# Patient Record
Sex: Male | Born: 1985 | Hispanic: No | Marital: Married | Smoking: Former smoker
Health system: Southern US, Community
[De-identification: ages and names within clinical notes are randomized; demographics above are authoritative.]

## PROBLEM LIST (undated history)

## (undated) DIAGNOSIS — Z789 Other specified health status: Secondary | ICD-10-CM

## (undated) HISTORY — DX: Other specified health status: Z78.9

## (undated) HISTORY — PX: OTHER SURGICAL HISTORY: SHX169

---

## 2021-02-18 ENCOUNTER — Other Ambulatory Visit: Payer: Self-pay

## 2021-02-19 ENCOUNTER — Telehealth: Payer: Self-pay

## 2021-02-19 ENCOUNTER — Ambulatory Visit: Payer: Self-pay

## 2021-02-19 DIAGNOSIS — R7612 Nonspecific reaction to cell mediated immunity measurement of gamma interferon antigen response without active tuberculosis: Secondary | ICD-10-CM | POA: Insufficient documentation

## 2021-02-19 NOTE — Progress Notes (Signed)
All information contained in this document/encounter was obtained/confirmed during phone interview with pt. Not taking any medications or MVI. Pt unsure of height or current weight. Pt believes he is not underweight. Pt states he is having an issue with left kidney (no diagnosis) that will be evaluated after referral to PCP. Pt states he will need assistance with transportation to [facility] for Chest X-Ray.

## 2021-02-19 NOTE — Telephone Encounter (Signed)
Phone call to pt with language line interpreter for [employer], ID# [id number]. See Epi completed in separate document/encounter dated 02/19/21.

## 2021-03-12 DIAGNOSIS — G8929 Other chronic pain: Secondary | ICD-10-CM | POA: Insufficient documentation

## 2021-03-12 DIAGNOSIS — Z0289 Encounter for other administrative examinations: Secondary | ICD-10-CM | POA: Insufficient documentation

## 2021-03-12 DIAGNOSIS — M545 Low back pain, unspecified: Secondary | ICD-10-CM | POA: Insufficient documentation

## 2021-03-15 ENCOUNTER — Ambulatory Visit
Admission: RE | Admit: 2021-03-15 | Discharge: 2021-03-15 | Disposition: A | Discharge status: Home / Self Care | Payer: Self-pay | Attending: Family Medicine | Admitting: Family Medicine

## 2021-03-15 ENCOUNTER — Ambulatory Visit
Admission: RE | Admit: 2021-03-15 | Discharge: 2021-03-15 | Disposition: A | Discharge status: Home / Self Care | Payer: Self-pay | Source: Ambulatory Visit | Attending: Family Medicine | Admitting: Family Medicine

## 2021-03-15 DIAGNOSIS — R7612 Nonspecific reaction to cell mediated immunity measurement of gamma interferon antigen response without active tuberculosis: Secondary | ICD-10-CM

## 2021-03-22 ENCOUNTER — Telehealth: Payer: Self-pay

## 2021-03-22 NOTE — Telephone Encounter (Signed)
Chest x-ray negative.   Need to: Offer LTBI   (If desired, case worker (John [phone]) may be able to help with transportation. Schedule extra time for LTBI med appts on Tues, Wed, or Thursday.   [facility] available now Waitlist for Rifampin) 

## 2021-03-22 NOTE — Telephone Encounter (Signed)
Phone call to pt with Language Line interpreter [phone].  Pt counseled about latent vs active TB and LTBI medications.  Pt will establish care with PCP; needs to establish care for kidney pain/issue.  May discuss TB meds with that Dr also.  Is not interested in taking LTBI at this time.  Pt counseled that if he decides to to start LTBI medicines he can call us at [facility].  We would need a list of his current medications at that time. It is a medicine you take consistently every month for several months, and would come back every month to discuss side effects, get a new bottle, and maybe bloodwork (dependant upon [facility] Dr orders).  Pt given [facility] contact info.

## 2021-04-23 ENCOUNTER — Telehealth: Payer: Self-pay

## 2021-04-23 NOTE — Telephone Encounter (Signed)
Phone call to pt with Pashto language line interpreter.   Pt is interested in LTBI medication.   Pt states he will get his medication information to [facility] as soon as possible and will also bring it with him to his appt.  LTBI start appt scheduled for 05/08/21, arrive at 8:00 AM.  Pt requested help in getting appt information to case worker since they do not have a car or any transportation. Case worker, Jonny Ruiz, contacted at [phone], per pt request. Jonny Ruiz will arrange transportation for the appt.

## 2021-04-29 ENCOUNTER — Other Ambulatory Visit: Payer: Self-pay

## 2021-04-29 DIAGNOSIS — R7612 Nonspecific reaction to cell mediated immunity measurement of gamma interferon antigen response without active tuberculosis: Secondary | ICD-10-CM

## 2021-04-29 NOTE — Progress Notes (Addendum)
Tuberculosis treatment orders  All patients are to be monitored per [place] and county TB policies.   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 Treat for latent TB per the following:  Rifampin 600mg  daily by mouth x 4 months  +Tspot 02/05/2021 CXR without Active TB 03/15/2021 Negative HIV 02/05/2021 EPI 02/19/2021 Negative HIV 02/05/2021 Refugee- [facility]  Current med: Naproxen Normal baseline labs 03/12/2021

## 2021-06-24 ENCOUNTER — Other Ambulatory Visit: Payer: Self-pay

## 2021-06-27 ENCOUNTER — Telehealth: Payer: Self-pay

## 2021-06-27 DIAGNOSIS — R7612 Nonspecific reaction to cell mediated immunity measurement of gamma interferon antigen response without active tuberculosis: Secondary | ICD-10-CM

## 2021-06-27 NOTE — Telephone Encounter (Signed)
TC to patient.  [facility] appt in December for LTBI  tx d/t transportation.  Patient states has permit and hopefully will have license in a month or 2.  TB control will call patient in 2 months to schedule LTBI appt.  Will need new TB orders and to update EPI at that time.Richmond Campbell, RN

## 2021-07-17 ENCOUNTER — Other Ambulatory Visit: Payer: Self-pay

## 2021-07-17 ENCOUNTER — Ambulatory Visit: Payer: Medicaid Other | Admitting: Nurse Practitioner

## 2021-07-17 ENCOUNTER — Encounter: Payer: Self-pay | Admitting: Nurse Practitioner

## 2021-07-17 VITALS — BP 119/74 | HR 70 | Temp 98.7°F | Ht 67.9 in | Wt 151.2 lb

## 2021-07-17 DIAGNOSIS — Z3009 Encounter for other general counseling and advice on contraception: Secondary | ICD-10-CM | POA: Diagnosis not present

## 2021-07-17 DIAGNOSIS — Z789 Other specified health status: Secondary | ICD-10-CM | POA: Insufficient documentation

## 2021-07-17 DIAGNOSIS — Z7689 Persons encountering health services in other specified circumstances: Secondary | ICD-10-CM | POA: Diagnosis not present

## 2021-07-17 DIAGNOSIS — R7612 Nonspecific reaction to cell mediated immunity measurement of gamma interferon antigen response without active tuberculosis: Secondary | ICD-10-CM | POA: Diagnosis not present

## 2021-07-17 NOTE — Progress Notes (Signed)
BP 119/74    Pulse 70    Temp 98.7 F (37.1 C) (Oral)    Ht 5' 7.9" (1.725 m)    Wt 151 lb 3.2 oz (68.6 kg)    SpO2 98%    BMI 23.06 kg/m    Subjective:    Patient ID: Victor Cross, male    DOB: 1986-03-13, 36 y.o.   MRN: [id number]  HPI: Victor Cross is a 36 y.o. male  Chief Complaint  Patient presents with   New Patient (Initial Visit)    Pt states that have been trying to have kids for 8 years with no success   Patient presents to clinic to establish care with new PCP.  Patient recently moved here from [place].  Introduced to Designer, jewellery role and practice setting.  All questions answered.  Discussed provider/patient relationship and expectations.  Patient denies any significant medical history.  Patient states he an his wife have been trying to conceive for 8 years and have not been successful.  States he has some bad teeth.  He would like to see a dentist to address this.  Denies any surgical history.  Patient did have a positive T spot but the chest xray was negative and did not show an acute infection.   Patient denies a history of: Hypertension, Elevated Cholesterol, Diabetes, Thyroid problems, Depression, Anxiety, Neurological problems, and Abdominal problems.    Denies HA, CP, SOB, dizziness, palpitations, visual changes, and lower extremity swelling.   Active Ambulatory Problems    Diagnosis Date Noted   Positive QuantiFERON-TB Gold test 02/19/2021   Chronic left-sided low back pain without sciatica 03/12/2021   Refugee health examination 03/12/2021   Patient denies medical problems    Resolved Ambulatory Problems    Diagnosis Date Noted   No Resolved Ambulatory Problems   No Additional Past Medical History   Past Surgical History:  Procedure Laterality Date   denies     History reviewed. No pertinent family history.    Review of Systems  HENT:  Positive for dental problem.   Eyes:  Negative for visual disturbance.  Respiratory:   Negative for shortness of breath.   Cardiovascular:  Negative for chest pain and leg swelling.  Genitourinary:        Not able to conceive.  Neurological:  Negative for light-headedness and headaches.   Per HPI unless specifically indicated above     Objective:    BP 119/74    Pulse 70    Temp 98.7 F (37.1 C) (Oral)    Ht 5' 7.9" (1.725 m)    Wt 151 lb 3.2 oz (68.6 kg)    SpO2 98%    BMI 23.06 kg/m   Wt Readings from Last 3 Encounters:  07/17/21 151 lb 3.2 oz (68.6 kg)    Physical Exam Vitals and nursing note reviewed.  Constitutional:      General: He is not in acute distress.    Appearance: Normal appearance. He is not ill-appearing, toxic-appearing or diaphoretic.  HENT:     Head: Normocephalic.     Right Ear: External ear normal.     Left Ear: External ear normal.     Nose: Nose normal. No congestion or rhinorrhea.     Mouth/Throat:     Mouth: Mucous membranes are moist.  Eyes:     General:        Right eye: No discharge.        Left eye: No discharge.  Extraocular Movements: Extraocular movements intact.     Conjunctiva/sclera: Conjunctivae normal.     Pupils: Pupils are equal, round, and reactive to light.  Cardiovascular:     Rate and Rhythm: Normal rate and regular rhythm.     Heart sounds: No murmur heard. Pulmonary:     Effort: Pulmonary effort is normal. No respiratory distress.     Breath sounds: Normal breath sounds. No wheezing, rhonchi or rales.  Abdominal:     General: Abdomen is flat. Bowel sounds are normal.  Musculoskeletal:     Cervical back: Normal range of motion and neck supple.  Skin:    General: Skin is warm and dry.     Capillary Refill: Capillary refill takes less than 2 seconds.  Neurological:     General: No focal deficit present.     Mental Status: He is alert and oriented to person, place, and time.  Psychiatric:        Mood and Affect: Mood normal.        Behavior: Behavior normal.        Thought Content: Thought content  normal.        Judgment: Judgment normal.    No results found for this or any previous visit.    Assessment & Plan:   Problem List Items Addressed This Visit       Other   Positive QuantiFERON-TB Gold test    Patient endorses having BCG prior to coming to the [place]. Blood work shows a T Best boy but no Quantiferon gold.  Chest xray was negative. Will draw Quantiferon Gold in office today. Will send to ID if positive.  Will make recommendations based on lab results       Relevant Orders   QuantiFERON-TB Gold Plus   Patient denies medical problems    Patient needs to see a dentist.  Has difficulty making appointments due to language barrier.  Will work with Referral coordinator to make patient appointments.      Other Visit Diagnoses     Family planning    -  Primary   Will refer patient to GYN for evaluation with his wife.  Patient understands and agrees with the plan of care.    Relevant Orders   Ambulatory referral to Obstetrics / Gynecology   Encounter to establish care            Follow up plan: Return in about 3 months (around 10/14/2021) for Physical and Fasting labs.

## 2021-07-17 NOTE — Assessment & Plan Note (Signed)
Patient endorses having BCG prior to coming to the [place]. Blood work shows a T SPOT but no Quantiferon gold.  Chest xray was negative. Will draw Quantiferon Gold in office today. Will send to ID if positive.  Will make recommendations based on lab results  °

## 2021-07-17 NOTE — Assessment & Plan Note (Signed)
Patient needs to see a dentist.  Has difficulty making appointments due to language barrier.  Will work with Referral coordinator to make patient appointments.

## 2021-07-20 LAB — QUANTIFERON-TB GOLD PLUS
QuantiFERON Mitogen Value: >10 IU/mL
QuantiFERON Nil Value: 1.1 IU/mL
QuantiFERON TB1 Ag Value: 8.34 IU/mL
QuantiFERON TB2 Ag Value: 6.9 IU/mL
QuantiFERON-TB Gold Plus: POSITIVE — AB

## 2021-07-23 ENCOUNTER — Telehealth: Payer: Self-pay | Admitting: Nurse Practitioner

## 2021-07-23 NOTE — Addendum Note (Signed)
Addended by: Larae Grooms on: 07/23/2021 09:40 AM   Modules accepted: Orders

## 2021-07-23 NOTE — Progress Notes (Signed)
Please let patient know that his Tuberculosis test was positive. I recommend that he be seen by Infectious Disease to determine further if he needs to be treated.

## 2021-07-23 NOTE — Telephone Encounter (Signed)
Copied from [place] [phone]. Topic: General - Other >> Jul 23, 2021 11:28 AM Pawlus, Brayton Layman A wrote: Reason for CRM: Caller from [facility] stated she can not contact the pt, not able to leave a VM or reach the pt. Please advise pt to contact [facility].

## 2021-07-24 NOTE — Telephone Encounter (Signed)
See result note, documented there.  ?

## 2021-08-29 ENCOUNTER — Encounter: Payer: Self-pay | Admitting: Infectious Diseases

## 2021-08-29 ENCOUNTER — Ambulatory Visit: Payer: Medicaid Other | Attending: Infectious Diseases | Admitting: Infectious Diseases

## 2021-08-29 VITALS — BP 109/71 | HR 65 | Temp 97.6°F | Wt 156.0 lb

## 2021-08-29 DIAGNOSIS — R7612 Nonspecific reaction to cell mediated immunity measurement of gamma interferon antigen response without active tuberculosis: Secondary | ICD-10-CM | POA: Diagnosis present

## 2021-08-29 DIAGNOSIS — M549 Dorsalgia, unspecified: Secondary | ICD-10-CM | POA: Insufficient documentation

## 2021-08-29 DIAGNOSIS — H9202 Otalgia, left ear: Secondary | ICD-10-CM | POA: Diagnosis not present

## 2021-08-29 DIAGNOSIS — G8929 Other chronic pain: Secondary | ICD-10-CM | POA: Diagnosis not present

## 2021-08-29 DIAGNOSIS — Z227 Latent tuberculosis: Secondary | ICD-10-CM | POA: Diagnosis not present

## 2021-08-29 NOTE — Patient Instructions (Signed)
You are referred for Latent TB- I spoke to health dept and Richmond Campbell [phone] will call you to set an appt for treatment ?

## 2021-08-29 NOTE — Progress Notes (Signed)
NAME: Victor Cross  ?DOB: 1985-07-28  ?MRN: [id number]  ?Date/Time: 08/29/2021 10:57 AM ? ?REQUESTING PROVIDER: Larae Cross ?Subjective:  ?REASON FOR CONSULT: positive quantiferon Gold ??pt speaks pashto- used AMS /str[employer] video interpretation service ?Victor Cross is a 36 y.o. male is here with his wife to discuss positive quantiferon gold test result and for management ?He is from [place] amd moved to the [place] last year- Had a positive T spot done at [facility] center as part of Refugee health work up- HIV NR,  ?His wife ( bibi, Rodman Key )is also positive- They both do not give a h/o treated TB, exposure to Tb or having TB in [place]- they are not sure whether they got BCG vaccine'- They dont know what that is ?HE was never hospitalized before. ?His T spot from Sept 2022 is positive ?HIV , HEPC, HEPB negative ?HE was referred to [facility] health Dept by [facility] center for treatment of positiev T spot but he could never keep the appt due to lack of transportation- HE as now moved to [place] and would like to get treatment ?HE also c/o left ear pain and was given Augmentin by his PCP with no improvement- he wants referral to ENT. He has chronic left sided back pain- renal function normal ? ? ?Past Medical History:  ?Diagnosis Date  ? Patient denies medical problems   ?  ?Past Surgical History:  ?Procedure Laterality Date  ? denies    ?  ?Social History  ? ?Socioeconomic History  ? Marital status: Married  ?  Spouse name: Not on file  ? Number of children: Not on file  ? Years of education: Not on file  ? Highest education level: Not on file  ?Occupational History  ? Not on file  ?Tobacco Use  ? Smoking status: Former  ?  Types: Cigarettes  ? Smokeless tobacco: Current  ?Vaping Use  ? Vaping Use: Never used  ?Substance and Sexual Activity  ? Alcohol use: Never  ? Drug use: Never  ? Sexual activity: Yes  ?  Birth control/protection: None  ?Other Topics Concern  ? Not on file  ?Social  History Narrative  ? Not on file  ? ?Social Determinants of Health  ? ?Financial Resource Strain: Not on file  ?Food Insecurity: Not on file  ?Transportation Needs: Not on file  ?Physical Activity: Not on file  ?Stress: Not on file  ?Social Connections: Not on file  ?Intimate Partner Violence: Not on file  ?  ?No family history on file. ?No Known Allergies ?I? ?No current outpatient medications on file.  ? ?No current facility-administered medications for this visit.  ?  ? ?Abtx:  ?Anti-infectives (From admission, onward)  ? ? None  ? ?  ? ? ?REVIEW OF SYSTEMS:  ?Const: negative fever, negative chills, negative weight loss ?Eyes: negative diplopia or visual changes, negative eye pain ?ENT: left ear ache ?Resp: negative cough, hemoptysis, dyspnea ?Cards: negative for chest pain, palpitations, lower extremity edema ?GU: negative for frequency, dysuria and hematuria ?GI: Negative for abdominal pain, diarrhea, bleeding, constipation ?Skin: negative for rash and pruritus ?Heme: negative for easy bruising and gum/nose bleeding ?MS: left sided back pain ?Neurolo:negative for headaches, dizziness, vertigo, memory problems  ?Psych: negative for feelings of anxiety, depression  ?Endocrine: negative for thyroid, diabetes ?Allergy/Immunology- negative for any medication or food allergies ? ?Objective:  ?VITALS:  ?BP 109/71   Pulse 65   Temp 97.6 ?F (36.4 ?C) (Temporal)   Wt 156 lb (  70.8 kg)   BMI 23.79 kg/m?  ?PHYSICAL EXAM:  ?General: Alert, cooperative, no distress, appears stated age.  ?Head: Normocephalic, without obvious abnormality, atraumatic. ?Eyes: Conjunctivae clear, anicteric sclerae. Pupils are equal ?ENT Nares normal. No drainage or sinus tenderness. ?Lips, mucosa, and tongue normal. No Thrush ?Neck: Supple, symmetrical, no adenopathy, thyroid: non tender ?no carotid bruit and no JVD. ?Back: No CVA tenderness. ?Lungs: Clear to auscultation bilaterally. No Wheezing or Rhonchi. No rales. ?Heart: Regular rate and  rhythm, no murmur, rub or gallop. ?Abdomen: Soft, non-tender,not distended. Bowel sounds normal. No masses ?Extremities: atraumatic, no cyanosis. No edema. No clubbing ?Skin: No rashes or lesions. Or bruising ?Lymph: Cervical, supraclavicular normal. ?Neurologic: Grossly non-focal ?Pertinent Labs ?Lab Results ?CBC ?No results found for: WBC, RBC, HGB, HCT, PLT, MCV, MCH, MCHC, RDW, LYMPHSABS, MONOABS, EOSABS, BASOSABS ? ?   ? View : No data to display.  ?  ?  ?  ? ? ? ?IMAGING RESULTS: ?CXR 03/15/21- Normal- no pulmonary infiltrate ?I have personally reviewed the films ?? ?Impression/Recommendation ?Pt from [place], has positive quantiferon Gold, neg CXR and neg symptoms ?Indicates TB exposure and no active TB ?Will refer to [place] health dept to start treatment for latent tb with either  Rifampin- x 4 months or Weekly INH rifapentine for 12 weeks ? ?Spoke to [employer]  [phone] and she will make an appt for next week with MD and will call them ?Discussed with patient in great detail ? ?PT to follow up with is PCP for ear pain and back pain both of which are chronic. ?Follow PRN ?? ?? ??Note:  This document was prepared using Dragon voice recognition software and may include unintentional dictation errors.  ?

## 2021-09-04 ENCOUNTER — Telehealth: Payer: Self-pay

## 2021-09-04 NOTE — Telephone Encounter (Signed)
Phone call to pt. Pt is interested in LTBI. If pt decides to take LTBI daily for 4 months rather than weekly for 12 weeks, orders in Epic dated 04/29/21. Pt has job interview coming up, so not sure about a work schedule. ? ?LTBI start appt scheduled for 09/11/21. ?

## 2021-09-04 NOTE — Telephone Encounter (Signed)
?  See Dr Verdi Blas orders and information provided in 04/29/21 encounter/orders only:  ?+Tspot 02/05/2021 ?CXR without Active TB 03/15/2021 ?Negative HIV 02/05/2021 ?EPI 02/19/2021 ?Negative HIV 02/05/2021 ?Refugee- [facility] ?Current med: Naproxen ?Normal baseline labs 03/12/2021 ?

## 2021-09-06 ENCOUNTER — Other Ambulatory Visit: Payer: Self-pay

## 2021-09-06 ENCOUNTER — Ambulatory Visit: Payer: Medicaid Other | Admitting: Family Medicine

## 2021-09-06 ENCOUNTER — Encounter: Payer: Self-pay | Admitting: Family Medicine

## 2021-09-06 VITALS — BP 113/74 | HR 67 | Temp 98.2°F | Wt 148.5 lb

## 2021-09-06 DIAGNOSIS — Z9189 Other specified personal risk factors, not elsewhere classified: Secondary | ICD-10-CM | POA: Insufficient documentation

## 2021-09-06 DIAGNOSIS — H60332 Swimmer's ear, left ear: Secondary | ICD-10-CM | POA: Diagnosis not present

## 2021-09-06 DIAGNOSIS — Z3169 Encounter for other general counseling and advice on procreation: Secondary | ICD-10-CM

## 2021-09-06 MED ORDER — NEOMYCIN-POLYMYXIN-HC 3.5-10000-1 OT SOLN: 4.0000 [drp] | Freq: Four times a day (QID) | OTIC | 0 refills | Status: DC

## 2021-09-06 MED ORDER — KETOROLAC TROMETHAMINE 60 MG/2ML IM SOLN
60.0000 mg | Freq: Once | INTRAMUSCULAR | Status: AC
  Administered 2021-09-06: 60 mg via INTRAMUSCULAR

## 2021-09-06 MED ORDER — IBUPROFEN 600 MG PO TABS: 600.0000 mg | ORAL_TABLET | Freq: Three times a day (TID) | ORAL | 1 refills | Status: AC | PRN

## 2021-09-06 NOTE — Assessment & Plan Note (Signed)
Needs appointment with dentistry. We will put in referral and see about helping to arrange this.  ?

## 2021-09-06 NOTE — Progress Notes (Signed)
? ?BP 113/74   Pulse 67   Temp 98.2 ?F (36.8 ?C)   Wt 148 lb 8 oz (67.4 kg)   SpO2 99%   BMI 22.65 kg/m?   ? ?Subjective:  ? ? Patient ID: Victor Cross, male    DOB: Dec 05, 1985, 35 y.o.   MRN: [id number] ? ?HPI: ?Victor Cross is a 36 y.o. male ? ?Chief Complaint  ?Patient presents with  ? Ear Pain  ?  Patient states he has been having left ear pain for many months. Patient was treated in [place] for it but it has started hurting again. Patient states he has noticed blood in his ear, but no hearing loss.   ? ?EAR PAIN ?Duration: about a year off and on ?Involved ear(s): left ?Severity:  severe  ?Quality:  aching and severe ?Fever: no ?Otorrhea: has blood come out of his ear ?Upper respiratory infection symptoms: no ?Pruritus: yes ?Hearing loss: no ?Water immersion no ?Using Q-tips: yes ?Recurrent otitis media: unknown ?Status: worse ?Treatments attempted: none ? ?Has been trying to conceive for 10 years without any luck. He would like to see infertility.  ? ?Relevant past medical, surgical, family and social history reviewed and updated as indicated. Interim medical history since our last visit reviewed. ?Allergies and medications reviewed and updated. ? ?Review of Systems  ?Constitutional: Negative.   ?HENT:  Positive for ear discharge and ear pain. Negative for congestion, dental problem, drooling, facial swelling, hearing loss, mouth sores, nosebleeds, postnasal drip, rhinorrhea, sinus pressure, sinus pain, sneezing, sore throat, tinnitus, trouble swallowing and voice change.   ?Respiratory: Negative.    ?Cardiovascular: Negative.   ?Musculoskeletal: Negative.   ?Psychiatric/Behavioral: Negative.    ? ?Per HPI unless specifically indicated above ? ?   ?Objective:  ?  ?BP 113/74   Pulse 67   Temp 98.2 ?F (36.8 ?C)   Wt 148 lb 8 oz (67.4 kg)   SpO2 99%   BMI 22.65 kg/m?   ?Wt Readings from Last 3 Encounters:  ?09/06/21 148 lb 8 oz (67.4 kg)  ?08/29/21 156 lb (70.8 kg)  ?07/17/21 151 lb 3.2 oz  (68.6 kg)  ?  ?Physical Exam ?Vitals and nursing note reviewed.  ?Constitutional:   ?   General: He is not in acute distress. ?   Appearance: Normal appearance. He is well-developed.  ?HENT:  ?   Head: Normocephalic and atraumatic.  ?   Right Ear: Hearing, tympanic membrane and external ear normal.  ?   Left Ear: Hearing normal.  ?   Nose: Nose normal.  ?   Mouth/Throat:  ?   Mouth: Mucous membranes are moist.  ?   Pharynx: Oropharynx is clear.  ?Eyes:  ?   General: Lids are normal. No scleral icterus.    ?   Right eye: No discharge.     ?   Left eye: No discharge.  ?   Conjunctiva/sclera: Conjunctivae normal.  ?Pulmonary:  ?   Effort: Pulmonary effort is normal. No respiratory distress.  ?Musculoskeletal:     ?   General: Normal range of motion.  ?Skin: ?   Coloration: Skin is not jaundiced or pale.  ?   Findings: No bruising, erythema, lesion or rash.  ?Neurological:  ?   Mental Status: He is alert and oriented to person, place, and time. Mental status is at baseline.  ?Psychiatric:     ?   Mood and Affect: Mood normal.     ?   Speech: Speech normal.     ?  Behavior: Behavior normal.     ?   Thought Content: Thought content normal.     ?   Judgment: Judgment normal.  ? ? ?Results for orders placed or performed in visit on 07/17/21  ?QuantiFERON-TB Gold Plus  ?Result Value Ref Range  ? QuantiFERON Incubation Incubation performed.   ? QuantiFERON Criteria Comment   ? QuantiFERON TB1 Ag Value 8.34 IU/mL  ? QuantiFERON TB2 Ag Value 6.90 IU/mL  ? QuantiFERON Nil Value 1.10 IU/mL  ? QuantiFERON Mitogen Value >10.00 IU/mL  ? QuantiFERON-TB Gold Plus Positive (A) Negative  ? ?   ?Assessment & Plan:  ? ?Problem List Items Addressed This Visit   ? ?  ? Other  ? Infertility counseling  ?  Has been trying for 10 years without conception. Unclear if wife has had evaluation. Will refer to infertility. Await their input. ? ?  ?  ? Relevant Orders  ? Ambulatory referral to Infertility  ? At risk for dental problems  ?  Needs  appointment with dentistry. We will put in referral and see about helping to arrange this.  ? ?  ?  ? Relevant Orders  ? Ambulatory referral to Dentistry  ? ?Other Visit Diagnoses   ? ? Acute swimmer's ear of left side    -  Primary  ? No perforation. Will treat with cortisoporin. Toradol shot for pain today as he is fasting. May start ibuprofen when not. Call with any concerns or not better.  ? Relevant Medications  ? ketorolac (TORADOL) injection 60 mg (Completed)  ? ?  ?  ? ?Follow up plan: ?Return As scheduled with PCP. ? ?>30 minutes spent with patient and wife today ? ? ? ? ? ?

## 2021-09-06 NOTE — Assessment & Plan Note (Signed)
Has been trying for 10 years without conception. Unclear if wife has had evaluation. Will refer to infertility. Await their input. ?

## 2021-09-11 ENCOUNTER — Ambulatory Visit (LOCAL_COMMUNITY_HEALTH_CENTER): Payer: Medicaid Other | Admitting: Surgery

## 2021-09-11 ENCOUNTER — Other Ambulatory Visit: Payer: Self-pay | Admitting: Surgery

## 2021-09-11 ENCOUNTER — Encounter: Payer: Self-pay | Admitting: Surgery

## 2021-09-11 VITALS — Wt 146.0 lb

## 2021-09-11 DIAGNOSIS — R7612 Nonspecific reaction to cell mediated immunity measurement of gamma interferon antigen response without active tuberculosis: Secondary | ICD-10-CM

## 2021-09-11 MED ORDER — RIFAMPIN 300 MG PO CAPS: 600.0000 mg | ORAL_CAPSULE | Freq: Every day | ORAL | 0 refills | Status: AC

## 2021-09-11 NOTE — Progress Notes (Signed)
+  QFT: 07/17/2021 and T-spot 02/05/2021 ?CXR: negative  03/15/2021 ?EPI: 02/19/2021 ?HIV: neg 02/05/2021 ? ?Tuberculosis treatment orders  ?All patients are to be monitored per [place] and county TB policies.   ?Mercury Omari__ has latent TB. Treat for latent TB per the following: ? ?Rifampin 600mg  daily by mouth x 4 months per Dr. .  ? ?No monthly or baseline labs currently indicated. Only needs labs if concerning symptoms arise or new medications reported that would require monitoring. ? ?Alvester Morin, MD  ?

## 2021-09-11 NOTE — Progress Notes (Signed)
Seen with Pashto phone interpreter. ? ?Pt is 36yo male born in [place] who had +T-Spot and +QFT during his immigration workup. ? ?CXR neg 03/15/2021  HIV neg 02/05/2021 ?QFT+ 07/17/2021  and  T-spot + 02/05/2021 ?EPI 02/19/2022 ? ?Patient wants to start tx for LTBI with Rifampin 600mg  daily for 4 months. Patient has signed agreements to treat and obtain labs if necessary. ? ?Dispensed Rifampin 300mg , #60 at today's visit. Potential side effects discussed, patient information sheet given along with contact number. Patient advised to refrain from drinking alcohol during treatment and if sexually active, to use additional birth control (condoms) since rifampin can reduce the efficacy of contraception. If any concerning side effects arise patient instructed to stop medication and contact TB staff immediately. ? ?Follow up appointment scheduled for 10/10/2021 8:20am but patient is starting new job and does not know if he and his wife can make this day/time. Will check in with Pashto interpreter 1 week ahead of time. ? ? , MD  ?

## 2021-10-12 ENCOUNTER — Telehealth: Payer: Self-pay | Admitting: Surgery

## 2021-10-12 DIAGNOSIS — R7612 Nonspecific reaction to cell mediated immunity measurement of gamma interferon antigen response without active tuberculosis: Secondary | ICD-10-CM

## 2021-10-12 NOTE — Telephone Encounter (Signed)
W Pashto interpreter, was calling to arrange 2nd Rifampin tx LTBI appt, but patient stopped taking medicine due to side effects: rashes, feeling unwell. Does not want to try a different regimen at this time, and due to [facility] shortage options are limited.   Told patient can contact [facility] anytime if they change their mind and would like to complete treatment for LTBI with Rifampin or other medication.   Closed to TB follow up.  Leigh Aurora, MD

## 2021-10-15 NOTE — Progress Notes (Unsigned)
There were no vitals taken for this visit.   Subjective:    Patient ID: Victor Cross, male    DOB: [birth year], 36 y.o.   MRN: [id number]  HPI: Victor Cross is a 36 y.o. male presenting on 10/16/2021 for comprehensive medical examination. Current medical complaints include:{Blank single:19197::"none","***"}  He currently lives with: Interim Problems from his last visit: {Blank single:19197::"yes","no"}  Depression Screen done today and results listed below:     08/29/2021   10:32 AM 07/17/2021    9:48 AM  Depression screen PHQ 2/9  Decreased Interest 0 0  Down, Depressed, Hopeless 0 0  PHQ - 2 Score 0 0    The patient {has/does not have:19849} a history of falls. I {did/did not:19850} complete a risk assessment for falls. A plan of care for falls {was/was not:19852} documented.   Past Medical History:  Past Medical History:  Diagnosis Date   Patient denies medical problems     Surgical History:  Past Surgical History:  Procedure Laterality Date   denies      Medications:  Current Outpatient Medications on File Prior to Visit  Medication Sig   ibuprofen (ADVIL) 600 MG tablet Take 1 tablet (600 mg total) by mouth every 8 (eight) hours as needed for moderate pain.   neomycin-polymyxin-hydrocortisone (CORTISPORIN) OTIC solution Place 4 drops into the left ear 4 (four) times daily.   No current facility-administered medications on file prior to visit.    Allergies:  No Known Allergies  Social History:  Social History   Socioeconomic History   Marital status: Married    Spouse name: Not on file   Number of children: Not on file   Years of education: Not on file   Highest education level: Not on file  Occupational History   Not on file  Tobacco Use   Smoking status: Former    Types: Cigarettes   Smokeless tobacco: Current  Vaping Use   Vaping Use: Never used  Substance and Sexual Activity   Alcohol use: Never   Drug use: Never   Sexual  activity: Yes    Birth control/protection: None  Other Topics Concern   Not on file  Social History Narrative   Not on file   Social Determinants of Health   Financial Resource Strain: Not on file  Food Insecurity: Not on file  Transportation Needs: Not on file  Physical Activity: Not on file  Stress: Not on file  Social Connections: Not on file  Intimate Partner Violence: Not on file   Social History   Tobacco Use  Smoking Status Former   Types: Cigarettes  Smokeless Tobacco Current   Social History   Substance and Sexual Activity  Alcohol Use Never    Family History:  No family history on file.  Past medical history, surgical history, medications, allergies, family history and social history reviewed with patient today and changes made to appropriate areas of the chart.   ROS All other ROS negative except what is listed above and in the HPI.      Objective:    There were no vitals taken for this visit.  Wt Readings from Last 3 Encounters:  09/11/21 146 lb (66.2 kg)  09/06/21 148 lb 8 oz (67.4 kg)  08/29/21 156 lb (70.8 kg)    Physical Exam  Results for orders placed or performed in visit on 07/17/21  QuantiFERON-TB Gold Plus  Result Value Ref Range   QuantiFERON Incubation Incubation performed.    QuantiFERON  Criteria Comment    QuantiFERON TB1 Ag Value 8.34 IU/mL   QuantiFERON TB2 Ag Value 6.90 IU/mL   QuantiFERON Nil Value 1.10 IU/mL   QuantiFERON Mitogen Value >10.00 IU/mL   QuantiFERON-TB Gold Plus Positive (A) Negative      Assessment & Plan:   Problem List Items Addressed This Visit   None Visit Diagnoses     Annual physical exam    -  Primary   Screening for ischemic heart disease            Discussed aspirin prophylaxis for myocardial infarction prevention and decision was {Blank single:19197::"it was not indicated","made to continue ASA","made to start ASA","made to stop ASA","that we recommended ASA, and patient  refused"}  LABORATORY TESTING:  Health maintenance labs ordered today as discussed above.   The natural history of prostate cancer and ongoing controversy regarding screening and potential treatment outcomes of prostate cancer has been discussed with the patient. The meaning of a false positive PSA and a false negative PSA has been discussed. He indicates understanding of the limitations of this screening test and wishes *** to proceed with screening PSA testing.   IMMUNIZATIONS:   - Tdap: Tetanus vaccination status reviewed: {tetanus status:315746}. - Influenza: {Blank single:19197::"Up to date","Administered today","Postponed to flu season","Refused","Given elsewhere"} - Pneumovax: {Blank single:19197::"Up to date","Administered today","Not applicable","Refused","Given elsewhere"} - Prevnar: {Blank single:19197::"Up to date","Administered today","Not applicable","Refused","Given elsewhere"} - COVID: {Blank single:19197::"Up to date","Administered today","Not applicable","Refused","Given elsewhere"} - HPV: {Blank single:19197::"Up to date","Administered today","Not applicable","Refused","Given elsewhere"} - Shingrix vaccine: {Blank single:19197::"Up to date","Administered today","Not applicable","Refused","Given elsewhere"}  SCREENING: - Colonoscopy: {Blank single:19197::"Up to date","Ordered today","Not applicable","Refused","Done elsewhere"}  Discussed with patient purpose of the colonoscopy is to detect colon cancer at curable precancerous or early stages   - AAA Screening: {Blank single:19197::"Up to date","Ordered today","Not applicable","Refused","Done elsewhere"}  -Hearing Test: {Blank single:19197::"Up to date","Ordered today","Not applicable","Refused","Done elsewhere"}  -Spirometry: {Blank single:19197::"Up to date","Ordered today","Not applicable","Refused","Done elsewhere"}   PATIENT COUNSELING:    Sexuality: Discussed sexually transmitted diseases, partner selection, use of  condoms, avoidance of unintended pregnancy  and contraceptive alternatives.   Advised to avoid cigarette smoking.  I discussed with the patient that most people either abstain from alcohol or drink within safe limits (<=14/week and <=4 drinks/occasion for males, <=7/weeks and <= 3 drinks/occasion for females) and that the risk for alcohol disorders and other health effects rises proportionally with the number of drinks per week and how often a drinker exceeds daily limits.  Discussed cessation/primary prevention of drug use and availability of treatment for abuse.   Diet: Encouraged to adjust caloric intake to maintain  or achieve ideal body weight, to reduce intake of dietary saturated fat and total fat, to limit sodium intake by avoiding high sodium foods and not adding table salt, and to maintain adequate dietary potassium and calcium preferably from fresh fruits, vegetables, and low-fat dairy products.    stressed the importance of regular exercise  Injury prevention: Discussed safety belts, safety helmets, smoke detector, smoking near bedding or upholstery.   Dental health: Discussed importance of regular tooth brushing, flossing, and dental visits.   Follow up plan: NEXT PREVENTATIVE PHYSICAL DUE IN 1 YEAR. No follow-ups on file.

## 2021-10-16 ENCOUNTER — Ambulatory Visit (INDEPENDENT_AMBULATORY_CARE_PROVIDER_SITE_OTHER): Payer: Medicaid Other | Admitting: Nurse Practitioner

## 2021-10-16 ENCOUNTER — Encounter: Payer: Self-pay | Admitting: Nurse Practitioner

## 2021-10-16 VITALS — BP 117/77 | HR 72 | Temp 98.5°F | Ht 67.72 in | Wt 148.6 lb

## 2021-10-16 DIAGNOSIS — Z0000 Encounter for general adult medical examination without abnormal findings: Secondary | ICD-10-CM

## 2021-10-16 DIAGNOSIS — Z1159 Encounter for screening for other viral diseases: Secondary | ICD-10-CM | POA: Diagnosis not present

## 2021-10-16 DIAGNOSIS — Z114 Encounter for screening for human immunodeficiency virus [HIV]: Secondary | ICD-10-CM

## 2021-10-16 DIAGNOSIS — Z136 Encounter for screening for cardiovascular disorders: Secondary | ICD-10-CM

## 2021-10-16 DIAGNOSIS — Z0289 Encounter for other administrative examinations: Secondary | ICD-10-CM

## 2021-10-16 LAB — URINALYSIS, ROUTINE W REFLEX MICROSCOPIC
Bilirubin, UA: NEGATIVE
Glucose, UA: NEGATIVE
Ketones, UA: NEGATIVE
Leukocytes,UA: NEGATIVE
Nitrite, UA: NEGATIVE
Protein,UA: NEGATIVE
Specific Gravity, UA: >1.03 — ABNORMAL HIGH (ref 1.005–1.030)
Urobilinogen, Ur: 0.2 mg/dL (ref 0.2–1.0)
pH, UA: 5.5 (ref 5.0–7.5)

## 2021-10-16 LAB — MICROSCOPIC EXAMINATION
Bacteria, UA: NONE SEEN
Epithelial Cells (non renal): NONE SEEN /hpf (ref 0–10)
WBC, UA: NONE SEEN /hpf (ref 0–5)

## 2021-10-17 LAB — COMPREHENSIVE METABOLIC PANEL
ALT: 23 IU/L (ref 0–44)
AST: 24 IU/L (ref 0–40)
Albumin/Globulin Ratio: 1.5 (ref 1.2–2.2)
Albumin: 4.2 g/dL (ref 4.0–5.0)
Alkaline Phosphatase: 108 IU/L (ref 44–121)
BUN/Creatinine Ratio: 16 (ref 9–20)
BUN: 11 mg/dL (ref 6–20)
Bilirubin Total: 0.8 mg/dL (ref 0.0–1.2)
CO2: 24 mmol/L (ref 20–29)
Calcium: 8.4 mg/dL — ABNORMAL LOW (ref 8.7–10.2)
Chloride: 106 mmol/L (ref 96–106)
Creatinine, Ser: 0.7 mg/dL — ABNORMAL LOW (ref 0.76–1.27)
Globulin, Total: 2.8 g/dL (ref 1.5–4.5)
Glucose: 82 mg/dL (ref 70–99)
Potassium: 4.1 mmol/L (ref 3.5–5.2)
Sodium: 140 mmol/L (ref 134–144)
Total Protein: 7 g/dL (ref 6.0–8.5)
eGFR: 124 mL/min/{1.73_m2} (ref >59)

## 2021-10-17 LAB — LIPID PANEL
Chol/HDL Ratio: 2.4 ratio (ref 0.0–5.0)
Cholesterol, Total: 113 mg/dL (ref 100–199)
HDL: 47 mg/dL (ref >39)
LDL Chol Calc (NIH): 55 mg/dL (ref 0–99)
Triglycerides: 47 mg/dL (ref 0–149)
VLDL Cholesterol Cal: 11 mg/dL (ref 5–40)

## 2021-10-17 LAB — CBC WITH DIFFERENTIAL/PLATELET
Basophils Absolute: 0 10*3/uL (ref 0.0–0.2)
Basos: 0 %
EOS (ABSOLUTE): 0.1 10*3/uL (ref 0.0–0.4)
Eos: 2 %
Hematocrit: 40.7 % (ref 37.5–51.0)
Hemoglobin: 13.9 g/dL (ref 13.0–17.7)
Immature Grans (Abs): 0 10*3/uL (ref 0.0–0.1)
Immature Granulocytes: 0 %
Lymphocytes Absolute: 1.7 10*3/uL (ref 0.7–3.1)
Lymphs: 39 %
MCH: 28.8 pg (ref 26.6–33.0)
MCHC: 34.2 g/dL (ref 31.5–35.7)
MCV: 84 fL (ref 79–97)
Monocytes Absolute: 0.4 10*3/uL (ref 0.1–0.9)
Monocytes: 8 %
Neutrophils Absolute: 2.2 10*3/uL (ref 1.4–7.0)
Neutrophils: 51 %
Platelets: 181 10*3/uL (ref 150–450)
RBC: 4.82 x10E6/uL (ref 4.14–5.80)
RDW: 13.1 % (ref 11.6–15.4)
WBC: 4.3 10*3/uL (ref 3.4–10.8)

## 2021-10-17 LAB — TSH: TSH: 1.03 u[IU]/mL (ref 0.450–4.500)

## 2021-10-17 LAB — HIV ANTIBODY (ROUTINE TESTING W REFLEX): HIV Screen 4th Generation wRfx: NONREACTIVE

## 2021-10-17 LAB — HEPATITIS C ANTIBODY: Hep C Virus Ab: NONREACTIVE

## 2021-10-17 NOTE — Progress Notes (Signed)
Please let patient know that his lab work looks good.  No concerns at this time.  Follow up as discussed.

## 2022-01-20 ENCOUNTER — Ambulatory Visit: Payer: Self-pay | Admitting: Physician Assistant

## 2022-01-20 NOTE — Progress Notes (Deleted)
          Acute Office Visit   Patient: Victor Cross   DOB: Oct 02, 1985   35 y.o. Male  MRN: [id number] Visit Date: 01/20/2022  Today's healthcare provider: Oswaldo Conroy Ahni Bradwell, PA-C  Introduced myself to the patient as a Secondary school teacher and provided education on APPs in clinical practice.    No chief complaint on file.  Subjective    HPI    Medications: Outpatient Medications Prior to Visit  Medication Sig   ibuprofen (ADVIL) 600 MG tablet Take 1 tablet (600 mg total) by mouth every 8 (eight) hours as needed for moderate pain. (Patient not taking: Reported on 10/16/2021)   neomycin-polymyxin-hydrocortisone (CORTISPORIN) OTIC solution Place 4 drops into the left ear 4 (four) times daily. (Patient not taking: Reported on 10/16/2021)   No facility-administered medications prior to visit.    Review of Systems  {Labs  Heme  Chem  Endocrine  Serology  Results Review (optional):23779}   Objective    There were no vitals taken for this visit. {Show previous vital signs (optional):23777}  Physical Exam    No results found for any visits on 01/20/22.  Assessment & Plan      No follow-ups on file.

## 2022-02-08 ENCOUNTER — Ambulatory Visit: Payer: Self-pay | Admitting: Internal Medicine

## 2022-02-08 ENCOUNTER — Other Ambulatory Visit: Payer: Self-pay

## 2022-02-08 ENCOUNTER — Encounter: Payer: Self-pay | Admitting: Internal Medicine

## 2022-02-08 VITALS — BP 107/76 | HR 74 | Temp 98.4°F | Ht 68.0 in | Wt 144.6 lb

## 2022-02-08 DIAGNOSIS — H7292 Unspecified perforation of tympanic membrane, left ear: Secondary | ICD-10-CM

## 2022-02-08 MED ORDER — OFLOXACIN 0.3 % OT SOLN: 5.0000 [drp] | Freq: Every day | OTIC | 0 refills | Status: AC

## 2022-02-08 MED ORDER — OFLOXACIN 0.3 % OT SOLN: 5.0000 [drp] | Freq: Every day | OTIC | Status: AC
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
## 2022-02-10 ENCOUNTER — Other Ambulatory Visit: Payer: Self-pay | Admitting: Internal Medicine

## 2022-02-11 LAB — CBC WITH DIFFERENTIAL/PLATELET
Basophils Absolute: 0 10*3/uL (ref 0.0–0.2)
Basos: 0 %
EOS (ABSOLUTE): 0.1 10*3/uL (ref 0.0–0.4)
Eos: 2 %
Hematocrit: 42.4 % (ref 37.5–51.0)
Hemoglobin: 15.1 g/dL (ref 13.0–17.7)
Immature Grans (Abs): 0 10*3/uL (ref 0.0–0.1)
Immature Granulocytes: 0 %
Lymphocytes Absolute: 2.2 10*3/uL (ref 0.7–3.1)
Lymphs: 40 %
MCH: 28.4 pg (ref 26.6–33.0)
MCHC: 35.6 g/dL (ref 31.5–35.7)
MCV: 80 fL (ref 79–97)
Monocytes Absolute: 0.4 10*3/uL (ref 0.1–0.9)
Monocytes: 8 %
Neutrophils Absolute: 2.7 10*3/uL (ref 1.4–7.0)
Neutrophils: 50 %
Platelets: 197 10*3/uL (ref 150–450)
RBC: 5.31 x10E6/uL (ref 4.14–5.80)
RDW: 12.6 % (ref 11.6–15.4)
WBC: 5.4 10*3/uL (ref 3.4–10.8)

## 2022-02-11 LAB — COMPREHENSIVE METABOLIC PANEL
ALT: 22 IU/L (ref 0–44)
AST: 21 IU/L (ref 0–40)
Albumin/Globulin Ratio: 1.5 (ref 1.2–2.2)
Albumin: 4.6 g/dL (ref 4.1–5.1)
Alkaline Phosphatase: 115 IU/L (ref 44–121)
BUN/Creatinine Ratio: 15 (ref 9–20)
BUN: 12 mg/dL (ref 6–20)
Bilirubin Total: 0.9 mg/dL (ref 0.0–1.2)
CO2: 25 mmol/L (ref 20–29)
Calcium: 9.6 mg/dL (ref 8.7–10.2)
Chloride: 104 mmol/L (ref 96–106)
Creatinine, Ser: 0.78 mg/dL (ref 0.76–1.27)
Globulin, Total: 3.1 g/dL (ref 1.5–4.5)
Glucose: 92 mg/dL (ref 70–99)
Potassium: 4.6 mmol/L (ref 3.5–5.2)
Sodium: 140 mmol/L (ref 134–144)
Total Protein: 7.7 g/dL (ref 6.0–8.5)
eGFR: 120 mL/min/{1.73_m2} (ref >59)

## 2022-02-11 LAB — LIPID PANEL W/O CHOL/HDL RATIO
Cholesterol, Total: 123 mg/dL (ref 100–199)
HDL: 47 mg/dL (ref >39)
LDL Chol Calc (NIH): 60 mg/dL (ref 0–99)
Triglycerides: 80 mg/dL (ref 0–149)
VLDL Cholesterol Cal: 16 mg/dL (ref 5–40)

## 2022-02-11 LAB — TSH: TSH: 2.43 u[IU]/mL (ref 0.450–4.500)

## 2022-02-11 LAB — HGB A1C W/O EAG: Hgb A1c MFr Bld: 5.1 % (ref 4.8–5.6)

## 2022-02-11 LAB — T4, FREE: Free T4: 1.64 ng/dL (ref 0.82–1.77)

## 2022-02-15 ENCOUNTER — Emergency Department: Payer: Self-pay

## 2022-02-15 ENCOUNTER — Emergency Department
Admission: EM | Admit: 2022-02-15 | Discharge: 2022-02-15 | Disposition: A | Discharge status: Home / Self Care | Payer: Self-pay | Attending: Student in an Organized Health Care Education/Training Program | Admitting: Student in an Organized Health Care Education/Training Program

## 2022-02-15 DIAGNOSIS — E860 Dehydration: Secondary | ICD-10-CM | POA: Insufficient documentation

## 2022-02-15 DIAGNOSIS — R55 Syncope and collapse: Secondary | ICD-10-CM | POA: Insufficient documentation

## 2022-02-15 DIAGNOSIS — H66005 Acute suppurative otitis media without spontaneous rupture of ear drum, recurrent, left ear: Secondary | ICD-10-CM | POA: Insufficient documentation

## 2022-02-15 DIAGNOSIS — Z20822 Contact with and (suspected) exposure to covid-19: Secondary | ICD-10-CM | POA: Insufficient documentation

## 2022-02-15 LAB — COMPREHENSIVE METABOLIC PANEL
ALT: 21 U/L (ref 0–44)
AST: 24 U/L (ref 15–41)
Albumin: 4.4 g/dL (ref 3.5–5.0)
Alkaline Phosphatase: 85 U/L (ref 38–126)
Anion gap: 3 — ABNORMAL LOW (ref 5–15)
BUN: 15 mg/dL (ref 6–20)
CO2: 28 mmol/L (ref 22–32)
Calcium: 8.9 mg/dL (ref 8.9–10.3)
Chloride: 109 mmol/L (ref 98–111)
Creatinine, Ser: 0.77 mg/dL (ref 0.61–1.24)
GFR, Estimated: >60 mL/min (ref >60)
Glucose, Bld: 82 mg/dL (ref 70–99)
Potassium: 3.9 mmol/L (ref 3.5–5.1)
Sodium: 140 mmol/L (ref 135–145)
Total Bilirubin: 1.3 mg/dL — ABNORMAL HIGH (ref 0.3–1.2)
Total Protein: 7.8 g/dL (ref 6.5–8.1)

## 2022-02-15 LAB — CBC WITH DIFFERENTIAL/PLATELET
Abs Immature Granulocytes: 0.01 10*3/uL (ref 0.00–0.07)
Basophils Absolute: 0 10*3/uL (ref 0.0–0.1)
Basophils Relative: 0 %
Eosinophils Absolute: 0.1 10*3/uL (ref 0.0–0.5)
Eosinophils Relative: 2 %
HCT: 40.5 % (ref 39.0–52.0)
Hemoglobin: 14.3 g/dL (ref 13.0–17.0)
Immature Granulocytes: 0 %
Lymphocytes Relative: 49 %
Lymphs Abs: 2.9 10*3/uL (ref 0.7–4.0)
MCH: 28.6 pg (ref 26.0–34.0)
MCHC: 35.3 g/dL (ref 30.0–36.0)
MCV: 81 fL (ref 80.0–100.0)
Monocytes Absolute: 0.5 10*3/uL (ref 0.1–1.0)
Monocytes Relative: 9 %
Neutro Abs: 2.4 10*3/uL (ref 1.7–7.7)
Neutrophils Relative %: 40 %
Platelets: 218 10*3/uL (ref 150–400)
RBC: 5 MIL/uL (ref 4.22–5.81)
RDW: 12.9 % (ref 11.5–15.5)
WBC: 6 10*3/uL (ref 4.0–10.5)
nRBC: 0 % (ref 0.0–0.2)

## 2022-02-15 LAB — SARS CORONAVIRUS 2 BY RT PCR: SARS Coronavirus 2 by RT PCR: NEGATIVE

## 2022-02-15 LAB — URINE DRUG SCREEN, QUALITATIVE (ARMC ONLY)
Amphetamines, Ur Screen: NOT DETECTED
Barbiturates, Ur Screen: NOT DETECTED
Benzodiazepine, Ur Scrn: NOT DETECTED
Cannabinoid 50 Ng, Ur ~~LOC~~: NOT DETECTED
Cocaine Metabolite,Ur ~~LOC~~: NOT DETECTED
MDMA (Ecstasy)Ur Screen: NOT DETECTED
Methadone Scn, Ur: NOT DETECTED
Opiate, Ur Screen: NOT DETECTED
Phencyclidine (PCP) Ur S: NOT DETECTED
Tricyclic, Ur Screen: NOT DETECTED

## 2022-02-15 LAB — TROPONIN I (HIGH SENSITIVITY)
Troponin I (High Sensitivity): 2 ng/L (ref <18)
Troponin I (High Sensitivity): 2 ng/L (ref <18)

## 2022-02-15 LAB — CBG MONITORING, ED: Glucose-Capillary: 77 mg/dL (ref 70–99)

## 2022-02-15 MED ORDER — ONDANSETRON HCL 4 MG/2ML IJ SOLN
4.0000 mg | Freq: Once | INTRAMUSCULAR | Status: AC
  Administered 2022-02-15: 4 mg via INTRAVENOUS
  Filled 2022-02-15: qty 2

## 2022-02-15 MED ORDER — AMOXICILLIN-POT CLAVULANATE 875-125 MG PO TABS
1.0000 | ORAL_TABLET | Freq: Once | ORAL | Status: AC
  Administered 2022-02-15: 1 via ORAL
  Filled 2022-02-15: qty 1

## 2022-02-15 MED ORDER — SODIUM CHLORIDE 0.9 % IV BOLUS
1000.0000 mL | Freq: Once | INTRAVENOUS | Status: AC
  Administered 2022-02-15: 1000 mL via INTRAVENOUS

## 2022-02-15 MED ORDER — MORPHINE SULFATE (PF) 4 MG/ML IV SOLN: 4.0000 mg | INTRAVENOUS | Status: DC | PRN

## 2022-02-15 MED ORDER — AMOXICILLIN-POT CLAVULANATE 500-125 MG PO TABS: 1.0000 | ORAL_TABLET | Freq: Two times a day (BID) | ORAL | 0 refills | Status: AC

## 2022-02-15 MED ORDER — GADOPICLENOL 0.5 MMOL/ML IV SOLN
6.0000 mL | Freq: Once | INTRAVENOUS | Status: AC | PRN
  Administered 2022-02-15: 7.5 mL via INTRAVENOUS

## 2022-02-15 MED ORDER — ACETAMINOPHEN 325 MG PO TABS
650.0000 mg | ORAL_TABLET | Freq: Once | ORAL | Status: AC
  Administered 2022-02-15: 650 mg via ORAL
  Filled 2022-02-15: qty 2

## 2022-02-15 NOTE — ED Triage Notes (Signed)
Pt arrives with c/o LOC x2. Pt had an episode in waiting room while checking in. Per family, pt has been c/o joint pain and ear pain for 4-5 days. Family denies fevers. Syncopal episode lasted 45-60 seconds. Pt c/o dizziness.

## 2022-02-15 NOTE — ED Notes (Signed)
Provided urinal for urine sample. °

## 2022-02-15 NOTE — ED Notes (Signed)
Pt to CT now

## 2022-02-15 NOTE — ED Notes (Signed)
EDP at bedside. Pt complains of severe joint pain for past 3-4 days. Pt was recently treated for ear infx. Pt had syncopal episode in triage. Wife and family member at bedside. Pt also had recent syncopal episode at work several days ago. Denies hx seizures.

## 2022-02-15 NOTE — ED Provider Notes (Addendum)
[facility] Provider Note    Event Date/Time   First MD Initiated Contact with Patient 02/15/22 1151     (approximate)   History   Loss of Consciousness   HPI  Victor Cross is a 36 y.o. male patient presents the ER for evaluation of generalized malaise earaches as well as syncopal episode.  Had 1 fainting spell 3 days ago had 1 witnessed in the ER today.  No measured fevers or chills.  No history of heart troubles or lung troubles.  No history of seizure or stroke.  Family is from [place].  History provided via friend who is serving as interpreter as we do not have translator available.  Has been taking topical otic drops for left ear infection.       Physical Exam   Triage Vital Signs: ED Triage Vitals [02/15/22 1141]  Enc Vitals Group     BP (!) 140/90     Pulse Rate 81     Resp 20     Temp 97.9 F (36.6 C)     Temp Source Oral     SpO2 100 %     Weight      Height      Head Circumference      Peak Flow      Pain Score      Pain Loc      Pain Edu?      Excl. in [place]?     Most recent vital signs: Vitals:   02/15/22 1600 02/15/22 1615  BP: 104/76   Pulse: (!) 58 (!) 58  Resp: 11 11  Temp:    SpO2: 97% 96%     Constitutional: Alert  Eyes: Conjunctivae are normal.  Head: Atraumatic. Nose: No congestion/rhinnorhea. Mouth/Throat: Mucous membranes are moist.  Purulent effusion of the left TM.  Mild discomfort of the mastoid but no erythema swelling or induration Neck: Painless ROM.  Cardiovascular:   Good peripheral circulation. Respiratory: Normal respiratory effort.  No retractions.  Gastrointestinal: Soft and nontender.  Musculoskeletal:  no deformity Neurologic:  MAE spontaneously. No gross focal neurologic deficits are appreciated.  Skin:  Skin is warm, dry and intact. No rash noted. Psychiatric: Mood and affect are normal. Speech and behavior are normal.    ED Results / Procedures / Treatments   Labs (all labs  ordered are listed, but only abnormal results are displayed) Labs Reviewed  COMPREHENSIVE METABOLIC PANEL - Abnormal; Notable for the following components:      Result Value   Total Bilirubin 1.3 (*)    Anion gap 3 (*)    All other components within normal limits  SARS CORONAVIRUS 2 BY RT PCR  CBC WITH DIFFERENTIAL/PLATELET  URINE DRUG SCREEN, QUALITATIVE ([facility] ONLY)  CBG MONITORING, ED  TROPONIN I (HIGH SENSITIVITY)  TROPONIN I (HIGH SENSITIVITY)     EKG  ED ECG REPORT I, Merlyn Lot, the attending physician, personally viewed and interpreted this ECG.   Date: 02/15/2022  EKG Time: 11:44  Rate: 90  Rhythm: sinus  Axis: normal  Intervals:normal  ST&T Change: no stemi, no depressions    RADIOLOGY Please see ED Course for my review and interpretation.  I personally reviewed all radiographic images ordered to evaluate for the above acute complaints and reviewed radiology reports and findings.  These findings were personally discussed with the patient.  Please see medical record for radiology report.    PROCEDURES:  Critical Care performed: No  Procedures   MEDICATIONS  ORDERED IN ED: Medications  morphine (PF) 4 MG/ML injection 4 mg (has no administration in time range)  sodium chloride 0.9 % bolus 1,000 mL (0 mLs Intravenous Stopped 02/15/22 1319)  acetaminophen (TYLENOL) tablet 650 mg (650 mg Oral Given 02/15/22 1319)  ondansetron (ZOFRAN) injection 4 mg (4 mg Intravenous Given 02/15/22 1320)  gadopiclenol (VUEWAY) 0.5 MMOL/ML solution 6 mL (7.5 mLs Intravenous Contrast Given 02/15/22 1513)  amoxicillin-clavulanate (AUGMENTIN) 875-125 MG per tablet 1 tablet (1 tablet Oral Given 02/15/22 1625)     IMPRESSION / MDM / ASSESSMENT AND PLAN / ED COURSE  I reviewed the triage vital signs and the nursing notes.                              Differential diagnosis includes, but is not limited to, dysrhythmia, anemia, sepsis, CVA, IPH, SAH, COVID  Patient presented  to the ER for evaluation of symptoms as described above.  This presenting complaint could reflect a potentially life-threatening illness therefore the patient will be placed on continuous pulse oximetry and telemetry for monitoring.  Laboratory evaluation will be sent to evaluate for the above complaints.  Does not have any focal deficits.  Appears clinically well nontoxic afebrile hemodynamically stable.    Clinical Course as of 02/15/22 1630  Sat Feb 15, 2022  1308 Chest x-ray on my review and interpretation without any evidence of acute infiltrate. [PR]  1314 Radiology called regarding patient CT imaging reporting findings concerning for otitis mastoiditis and possible erosion of the temporal bone.  Has recommended MRI with and without to further evaluate which I have ordered. [PR]  1504 Patient just underwent MRI.   [PR]  1623 MRI result shows evidence of otitis media with mastoid effusion mastoiditis per report.  Patient without a white count troponin negative.  Feeling significantly improved after IV fluids.  No significant pain swelling or redness behind the ear does have some mild discomfort.  Given the reported imaging findings, have discussed case in consultation with Dr. Willeen Cass of ENT.  No indication for intervention at this time as no sign of bony erosion on MRI.  Findings c/w otitis and appropriate for medical therapy.  Patient is at his baseline and is not toxic not septic.  Not meningitic.  Further discussion with the family patient has been working very long hours is not eating and drinking very much while at work.  He does feel lightheaded prior to these episodes like he is about to pass out.  Did not bite his tongue or lose control of bladder..  No postictal period is not having any altered mental status or confusion.  At this point given his presentation I do believe that outpatient management is appropriate.  Patient and family agreeable to plan. [PR]    Clinical Course User  Index [PR] Willy Eddy, MD    FINAL CLINICAL IMPRESSION(S) / ED DIAGNOSES   Final diagnoses:  Dehydration  Syncope, unspecified syncope type  Recurrent acute suppurative otitis media without spontaneous rupture of left tympanic membrane     Rx / DC Orders   ED Discharge Orders          Ordered    amoxicillin-clavulanate (AUGMENTIN) 500-125 MG tablet  2 times daily        02/15/22 1622             Note:  This document was prepared using Dragon voice recognition software and may include unintentional dictation errors.  Willy Eddy, MD 02/15/22 1630

## 2022-02-15 NOTE — ED Notes (Signed)
EDP at bedside explaining findings so far and plan of care. [employer] portable interpreter does not have pt languages available, friend of pt used to interpret. Pt and wife consent to this.

## 2022-02-15 NOTE — ED Notes (Signed)
Pt now complaining of severe HA, whole head, that just started. CT called, on way to come for pt for head CT. Pt follows commands. Eyes track equally. Bilateral hand grip weak is equal but weak. Face symmetrical. .

## 2022-02-15 NOTE — ED Notes (Signed)
Pt to MRI now

## 2022-02-20 ENCOUNTER — Telehealth: Payer: Self-pay

## 2022-03-01 ENCOUNTER — Ambulatory Visit: Payer: Self-pay | Admitting: Internal Medicine

## 2022-03-01 ENCOUNTER — Encounter: Payer: Self-pay | Admitting: Internal Medicine

## 2022-03-01 VITALS — BP 115/72 | HR 80 | Temp 97.9°F | Ht 70.08 in | Wt 148.8 lb

## 2022-03-01 DIAGNOSIS — F17200 Nicotine dependence, unspecified, uncomplicated: Secondary | ICD-10-CM | POA: Insufficient documentation

## 2022-03-01 DIAGNOSIS — M791 Myalgia, unspecified site: Secondary | ICD-10-CM

## 2022-03-01 DIAGNOSIS — M1990 Unspecified osteoarthritis, unspecified site: Secondary | ICD-10-CM | POA: Insufficient documentation

## 2022-03-01 DIAGNOSIS — H7292 Unspecified perforation of tympanic membrane, left ear: Secondary | ICD-10-CM

## 2022-03-01 NOTE — Assessment & Plan Note (Signed)
Pt was advised to use antibiotic drops twice a day for 10 days. He was also advised to take amoxicillin for 10 days twice a day.

## 2022-03-01 NOTE — Progress Notes (Signed)
Established Patient Office Visit  Subjective:  Patient ID: Victor Cross, male    DOB: [birth year]  Age: 36 y.o. MRN: [id number]  CC:  Chief Complaint  Patient presents with   Follow-up    HPI  Victor Cross presents for arthritis and myalgia.   Past Medical History:  Diagnosis Date   Patient denies medical problems     Past Surgical History:  Procedure Laterality Date   denies      History reviewed. No pertinent family history.  Social History   Socioeconomic History   Marital status: Married    Spouse name: Not on file   Number of children: Not on file   Years of education: Not on file   Highest education level: Not on file  Occupational History   Not on file  Tobacco Use   Smoking status: Former    Types: Cigarettes   Smokeless tobacco: Current    Types: Chew  Vaping Use   Vaping Use: Never used  Substance and Sexual Activity   Alcohol use: Never   Drug use: Never   Sexual activity: Yes    Birth control/protection: None  Other Topics Concern   Not on file  Social History Narrative   Not on file   Social Determinants of Health   Financial Resource Strain: Not on file  Food Insecurity: Not on file  Transportation Needs: Not on file  Physical Activity: Not on file  Stress: Not on file  Social Connections: Not on file  Intimate Partner Violence: Not on file     Current Outpatient Medications:    ibuprofen (ADVIL) 600 MG tablet, Take 1 tablet (600 mg total) by mouth every 8 (eight) hours as needed for moderate pain. (Patient not taking: Reported on 10/16/2021), Disp: 90 tablet, Rfl: 1   ofloxacin (FLOXIN OTIC) 0.3 % OTIC solution, Place 5 drops into the left ear daily. (Patient not taking: Reported on 02/15/2022), Disp: 5 mL, Rfl: 0  Current Facility-Administered Medications:    ofloxacin (FLOXIN) 0.3 % OTIC (EAR) solution 5 drop, 5 drop, Left EAR, Daily, Tejan-Sie, S Ahmed, MD   No Known Allergies  ROS Review of Systems  All  other systems reviewed and are negative.     Objective:    Physical Exam Constitutional:      Appearance: Normal appearance.  Cardiovascular:     Rate and Rhythm: Regular rhythm.     Heart sounds: Normal heart sounds.     BP 115/72 (BP Location: Left Arm, Patient Position: Sitting, Cuff Size: Normal)   Pulse 80   Temp 97.9 F (36.6 C) (Temporal)   Ht 5' 10.08" (1.78 m)   Wt 148 lb 12.8 oz (67.5 kg)   SpO2 99%   BMI 21.30 kg/m  Wt Readings from Last 3 Encounters:  03/01/22 148 lb 12.8 oz (67.5 kg)  02/08/22 144 lb 9.6 oz (65.6 kg)  10/16/21 148 lb 9.6 oz (67.4 kg)     Health Maintenance Due  Topic Date Due   COVID-19 Vaccine (3 - Pfizer series) 02/24/2021   INFLUENZA VACCINE  12/24/2021    There are no preventive care reminders to display for this patient.  Lab Results  Component Value Date   TSH 2.430 02/10/2022   Lab Results  Component Value Date   WBC 6.0 02/15/2022   HGB 14.3 02/15/2022   HCT 40.5 02/15/2022   MCV 81.0 02/15/2022   PLT 218 02/15/2022   Lab Results  Component Value Date  NA 140 02/15/2022   K 3.9 02/15/2022   CO2 28 02/15/2022   GLUCOSE 82 02/15/2022   BUN 15 02/15/2022   CREATININE 0.77 02/15/2022   BILITOT 1.3 (H) 02/15/2022   ALKPHOS 85 02/15/2022   AST 24 02/15/2022   ALT 21 02/15/2022   PROT 7.8 02/15/2022   ALBUMIN 4.4 02/15/2022   CALCIUM 8.9 02/15/2022   ANIONGAP 3 (L) 02/15/2022   EGFR 120 02/10/2022   Lab Results  Component Value Date   CHOL 123 02/10/2022   Lab Results  Component Value Date   HDL 47 02/10/2022   Lab Results  Component Value Date   LDLCALC 60 02/10/2022   Lab Results  Component Value Date   TRIG 80 02/10/2022   Lab Results  Component Value Date   CHOLHDL 2.4 10/16/2021   Lab Results  Component Value Date   HGBA1C 5.1 02/10/2022      Assessment & Plan:   Problem List Items Addressed This Visit   None   No orders of the defined types were placed in this  encounter.   Follow-up:  2 month   Cletis Athens, MD

## 2022-03-01 NOTE — Assessment & Plan Note (Signed)
Pt was advised to reduce usage naswar a tobacco product.

## 2022-03-01 NOTE — Assessment & Plan Note (Signed)
Will do an antinuclear antibody

## 2022-03-11 ENCOUNTER — Other Ambulatory Visit: Payer: Self-pay | Admitting: Internal Medicine

## 2022-03-13 LAB — TESTOSTERONE: Testosterone: 642 ng/dL (ref 264–916)

## 2022-03-13 LAB — HCV FIBROSURE
ALPHA 2-MACROGLOBULINS, QN: 165 mg/dL (ref 110–276)
ALT (SGPT) P5P: 23 IU/L (ref 0–55)
Apolipoprotein A-1: 135 mg/dL (ref 101–178)
Bilirubin, Total: 0.7 mg/dL (ref 0.0–1.2)
Fibrosis Score: 0.14 (ref 0.00–0.21)
GGT: 9 IU/L (ref 0–65)
Haptoglobin: 64 mg/dL (ref 17–317)
Necroinflammat Activity Score: 0.08 (ref 0.00–0.17)

## 2022-03-13 LAB — ANA: Anti Nuclear Antibody (ANA): NEGATIVE

## 2022-03-28 ENCOUNTER — Telehealth: Payer: Self-pay

## 2022-03-28 NOTE — Telephone Encounter (Signed)
Called patient to remind them about their appointment.  No answer, left msg //AS

## 2022-03-29 ENCOUNTER — Ambulatory Visit: Payer: Self-pay | Admitting: Internal Medicine

## 2022-03-29 ENCOUNTER — Encounter: Payer: Self-pay | Admitting: Internal Medicine

## 2022-03-29 VITALS — BP 120/73 | HR 74 | Temp 98.0°F | Ht 68.9 in | Wt 148.6 lb

## 2022-03-29 DIAGNOSIS — M1990 Unspecified osteoarthritis, unspecified site: Secondary | ICD-10-CM

## 2022-03-29 DIAGNOSIS — H7292 Unspecified perforation of tympanic membrane, left ear: Secondary | ICD-10-CM

## 2022-03-29 NOTE — Progress Notes (Addendum)
   Established Patient Office Visit  Subjective   Patient ID: Victor Cross, male    DOB: 10/04/1985  Age: 36 y.o. MRN: [id number]  Chief Complaint  Patient presents with   Ear Pain    Pt is currently in treatment for ear infection     Arm Pain    Pt is having a pain in his joints and finger early in the morning and pt is currently stating that it is not induced by temp      HPI He was seen at [facility] for left ear pain. Noted ear drum perforation and polyp. Currently using ear drops along with oral antibiotic. C/O 2 wk h/o morning joint stiffness which goes away on its on.   ROS As per HPI    Objective:     BP 120/73 (BP Location: Right Arm, Patient Position: Sitting, Cuff Size: Normal)   Pulse 74   Temp 98 F (36.7 C) (Skin)   Ht 5' 8.9" (1.75 m)   Wt 148 lb 9.6 oz (67.4 kg)   SpO2 99%   BMI 22.01 kg/m    Physical Exam CV - RR PULM-CTA [place]_ No joint swelling, redness or tenderness.  No results found for any visits on 03/29/22.   Labs reviewed and d/w the pt  The ASCVD Risk score (Arnett DK, et al., 2019) failed to calculate for the following reasons:   The 2019 ASCVD risk score is only valid for ages 17 to 79    Assessment & Plan:   Continue ear infection treatment Check vitamin D   Problem List Items Addressed This Visit   None   F/U in 2 weeks   Wallene Huh, MD

## 2022-04-08 ENCOUNTER — Other Ambulatory Visit: Payer: Self-pay | Admitting: Internal Medicine

## 2022-04-09 LAB — VITAMIN D 25 HYDROXY (VIT D DEFICIENCY, FRACTURES): Vit D, 25-Hydroxy: 20.9 ng/mL — ABNORMAL LOW (ref 30.0–100.0)

## 2022-04-12 ENCOUNTER — Ambulatory Visit: Payer: Self-pay | Admitting: Adult Health

## 2022-04-26 ENCOUNTER — Ambulatory Visit: Payer: Self-pay | Admitting: Internal Medicine

## 2022-05-10 ENCOUNTER — Ambulatory Visit: Payer: Self-pay | Admitting: Internal Medicine

## 2022-05-31 ENCOUNTER — Ambulatory Visit: Payer: Self-pay | Admitting: Internal Medicine

## 2022-05-31 ENCOUNTER — Encounter: Payer: Self-pay | Admitting: Internal Medicine

## 2022-05-31 VITALS — BP 128/71 | HR 77 | Temp 98.3°F | Ht 68.5 in | Wt 154.2 lb

## 2022-05-31 DIAGNOSIS — H9202 Otalgia, left ear: Secondary | ICD-10-CM

## 2022-05-31 NOTE — Progress Notes (Signed)
Internal MEDICINE  Office Visit Note  Patient Name: Victor Cross  [id number]  [id number]  Date of Service: 05/31/2022  Chief Complaint  Patient presents with   Follow-up    Pt reports pain behind left ear after surgery     HPI  Pt is here for routine follow up.  Left ear surg-  in dec 2023- [facility]- ? Cocklear implants Pain some behind ear- post surgical pain Chronic issue F/u at [facility] this month Loss of hearing Not much drainage Using drops- oflaxacin GTT  Current Medication: Outpatient Encounter Medications as of 05/31/2022  Medication Sig   amoxicillin (AMOXIL) 125 MG chewable tablet Chew 125 mg by mouth 2 (two) times daily. Pt is taking amox due to ear infection   ibuprofen (ADVIL) 600 MG tablet Take 1 tablet (600 mg total) by mouth every 8 (eight) hours as needed for moderate pain. (Patient not taking: Reported on 10/16/2021)   ofloxacin (FLOXIN OTIC) 0.3 % OTIC solution Place 5 drops into the left ear daily. (Patient not taking: Reported on 02/15/2022)   Facility-Administered Encounter Medications as of 05/31/2022  Medication   ofloxacin (FLOXIN) 0.3 % OTIC (EAR) solution 5 drop    Surgical History: Past Surgical History:  Procedure Laterality Date   denies      Medical History: Past Medical History:  Diagnosis Date   Patient denies medical problems     Family History: History reviewed. No pertinent family history.  Social History   Socioeconomic History   Marital status: Married    Spouse name: Not on file   Number of children: Not on file   Years of education: Not on file   Highest education level: Not on file  Occupational History   Not on file  Tobacco Use   Smoking status: Former    Types: Cigarettes   Smokeless tobacco: Current    Types: Chew  Vaping Use   Vaping Use: Never used  Substance and Sexual Activity   Alcohol use: Never   Drug use: Never   Sexual activity: Yes    Birth control/protection: None  Other  Topics Concern   Not on file  Social History Narrative   Not on file   Social Determinants of Health   Financial Resource Strain: Not on file  Food Insecurity: Not on file  Transportation Needs: Not on file  Physical Activity: Not on file  Stress: Not on file  Social Connections: Not on file  Intimate Partner Violence: Not on file      Review of Systems  Vital Signs: BP 128/71 (BP Location: Right Arm, Patient Position: Sitting, Cuff Size: Normal)   Pulse 77   Temp 98.3 F (36.8 C) (Core)   Ht 5' 8.5" (1.74 m)   Wt 154 lb 3.2 oz (69.9 kg)   SpO2 98%   BMI 23.10 kg/m    Physical Exam     Assessment/Plan: Left ear pain S/p Surg with implants- ? Cocklear - f/u with [facility] ENT Some hearing  Surgical site - well healed Pain control Ibuprofen 400 mg BID prn- RX given  General Counseling: Iren verbalizes understanding of the findings of todays visit and agrees with plan of treatment. I have discussed any further diagnostic evaluation that may be needed or ordered today. We also reviewed his medications today. he has been encouraged to call the office with any questions or concerns that should arise related to todays visit.    No orders of the defined types were  placed in this encounter.   No orders of the defined types were placed in this encounter.       Wenda Low

## 2022-10-21 ENCOUNTER — Encounter: Payer: Self-pay | Admitting: Nurse Practitioner

## 2022-10-29 ENCOUNTER — Encounter: Payer: Self-pay | Admitting: Nurse Practitioner

## 2022-10-29 NOTE — Progress Notes (Deleted)
There were no vitals taken for this visit.   Subjective:    Patient ID: Victor Cross, male    DOB: 08-Dec-1985, 37 y.o.   MRN: [id number]  HPI: Victor Cross is a 37 y.o. male presenting on 10/29/2022 for comprehensive medical examination. Current medical complaints include: Trying to conceive  He currently lives with: Interim Problems from his last visit: no   Denies HA, CP, SOB, dizziness, palpitations, visual changes, and lower extremity swelling.  Depression Screen done today and results listed below:     10/16/2021    9:38 AM 08/29/2021   10:32 AM 07/17/2021    9:48 AM  Depression screen PHQ 2/9  Decreased Interest 0 0 0  Down, Depressed, Hopeless 0 0 0  PHQ - 2 Score 0 0 0  Altered sleeping 0    Tired, decreased energy 0    Change in appetite 0    Feeling bad or failure about yourself  0    Trouble concentrating 0    Moving slowly or fidgety/restless 0    Suicidal thoughts 0    PHQ-9 Score 0    Difficult doing work/chores Not difficult at all      The patient does not have a history of falls. I did complete a risk assessment for falls. A plan of care for falls was documented.   Past Medical History:  Past Medical History:  Diagnosis Date  . Patient denies medical problems     Surgical History:  Past Surgical History:  Procedure Laterality Date  . denies      Medications:  Current Outpatient Medications on File Prior to Visit  Medication Sig  . amoxicillin (AMOXIL) 125 MG chewable tablet Chew 125 mg by mouth 2 (two) times daily. Pt is taking amox due to ear infection  . ibuprofen (ADVIL) 600 MG tablet Take 1 tablet (600 mg total) by mouth every 8 (eight) hours as needed for moderate pain. (Patient not taking: Reported on 10/16/2021)  . ofloxacin (FLOXIN OTIC) 0.3 % OTIC solution Place 5 drops into the left ear daily. (Patient not taking: Reported on 02/15/2022)   Current Facility-Administered Medications on File Prior to Visit  Medication  .  ofloxacin (FLOXIN) 0.3 % OTIC (EAR) solution 5 drop    Allergies:  No Known Allergies  Social History:  Social History   Socioeconomic History  . Marital status: Married    Spouse name: Not on file  . Number of children: Not on file  . Years of education: Not on file  . Highest education level: Not on file  Occupational History  . Not on file  Tobacco Use  . Smoking status: Former    Types: Cigarettes  . Smokeless tobacco: Current    Types: Chew  Vaping Use  . Vaping Use: Never used  Substance and Sexual Activity  . Alcohol use: Never  . Drug use: Never  . Sexual activity: Yes    Birth control/protection: None  Other Topics Concern  . Not on file  Social History Narrative  . Not on file   Social Determinants of Health   Financial Resource Strain: Not on file  Food Insecurity: Not on file  Transportation Needs: Not on file  Physical Activity: Not on file  Stress: Not on file  Social Connections: Not on file  Intimate Partner Violence: Not on file   Social History   Tobacco Use  Smoking Status Former  . Types: Cigarettes  Smokeless Tobacco Current  . Types:  Chew   Social History   Substance and Sexual Activity  Alcohol Use Never    Family History:  No family history on file.  Past medical history, surgical history, medications, allergies, family history and social history reviewed with patient today and changes made to appropriate areas of the chart.   Review of Systems  Eyes:  Negative for blurred vision and double vision.  Respiratory:  Negative for shortness of breath.   Cardiovascular:  Negative for chest pain, palpitations and leg swelling.  Neurological:  Negative for dizziness and headaches.  All other ROS negative except what is listed above and in the HPI.      Objective:    There were no vitals taken for this visit.  Wt Readings from Last 3 Encounters:  05/31/22 154 lb 3.2 oz (69.9 kg)  03/29/22 148 lb 9.6 oz (67.4 kg)  03/01/22 148  lb 12.8 oz (67.5 kg)    Physical Exam Vitals and nursing note reviewed.  Constitutional:      General: He is not in acute distress.    Appearance: Normal appearance. He is not ill-appearing, toxic-appearing or diaphoretic.  HENT:     Head: Normocephalic.     Right Ear: Tympanic membrane, ear canal and external ear normal.     Left Ear: Tympanic membrane, ear canal and external ear normal.     Nose: Nose normal. No congestion or rhinorrhea.     Mouth/Throat:     Mouth: Mucous membranes are moist.  Eyes:     General:        Right eye: No discharge.        Left eye: No discharge.     Extraocular Movements: Extraocular movements intact.     Conjunctiva/sclera: Conjunctivae normal.     Pupils: Pupils are equal, round, and reactive to light.  Cardiovascular:     Rate and Rhythm: Normal rate and regular rhythm.     Heart sounds: No murmur heard. Pulmonary:     Effort: Pulmonary effort is normal. No respiratory distress.     Breath sounds: Normal breath sounds. No wheezing, rhonchi or rales.  Abdominal:     General: Abdomen is flat. Bowel sounds are normal. There is no distension.     Palpations: Abdomen is soft.     Tenderness: There is no abdominal tenderness. There is no guarding.  Musculoskeletal:     Cervical back: Normal range of motion and neck supple.  Skin:    General: Skin is warm and dry.     Capillary Refill: Capillary refill takes less than 2 seconds.  Neurological:     General: No focal deficit present.     Mental Status: He is alert and oriented to person, place, and time.     Cranial Nerves: No cranial nerve deficit.     Motor: No weakness.     Deep Tendon Reflexes: Reflexes normal.  Psychiatric:        Mood and Affect: Mood normal.        Behavior: Behavior normal.        Thought Content: Thought content normal.        Judgment: Judgment normal.    Results for orders placed or performed in visit on 04/08/22  VITAMIN D 25 Hydroxy (Vit-D Deficiency, Fractures)   Result Value Ref Range   Vit D, 25-Hydroxy 20.9 (L) 30.0 - 100.0 ng/mL      Assessment & Plan:   Problem List Items Addressed This Visit   None  Discussed aspirin prophylaxis for myocardial infarction prevention and decision was it was not indicated  LABORATORY TESTING:  Health maintenance labs ordered today as discussed above.      IMMUNIZATIONS:   - Tdap: Tetanus vaccination status reviewed: last tetanus booster within 10 years. - Influenza: Postponed to flu season - Pneumovax: Not applicable - Prevnar: Not applicable - COVID: Up to date - HPV: Not applicable - Shingrix vaccine: Not applicable  SCREENING: - Colonoscopy: Not applicable  Discussed with patient purpose of the colonoscopy is to detect colon cancer at curable precancerous or early stages   - AAA Screening: Not applicable  -Hearing Test: Not applicable  -Spirometry: Not applicable   PATIENT COUNSELING:    Sexuality: Discussed sexually transmitted diseases, partner selection, use of condoms, avoidance of unintended pregnancy  and contraceptive alternatives.   Advised to avoid cigarette smoking.  I discussed with the patient that most people either abstain from alcohol or drink within safe limits (<=14/week and <=4 drinks/occasion for males, <=7/weeks and <= 3 drinks/occasion for females) and that the risk for alcohol disorders and other health effects rises proportionally with the number of drinks per week and how often a drinker exceeds daily limits.  Discussed cessation/primary prevention of drug use and availability of treatment for abuse.   Diet: Encouraged to adjust caloric intake to maintain  or achieve ideal body weight, to reduce intake of dietary saturated fat and total fat, to limit sodium intake by avoiding high sodium foods and not adding table salt, and to maintain adequate dietary potassium and calcium preferably from fresh fruits, vegetables, and low-fat dairy products.    stressed the  importance of regular exercise  Injury prevention: Discussed safety belts, safety helmets, smoke detector, smoking near bedding or upholstery.   Dental health: Discussed importance of regular tooth brushing, flossing, and dental visits.   Follow up plan: NEXT PREVENTATIVE PHYSICAL DUE IN 1 YEAR. No follow-ups on file.

## 2023-05-20 IMAGING — CR DG CHEST 1V
1 series · 1 of 1 positions shown · non-contrast
Comparison: None.

CLINICAL DATA: Positive QuantiFERON TB gold test.

EXAM:
CHEST  1 VIEW

[dg chest 1 view]
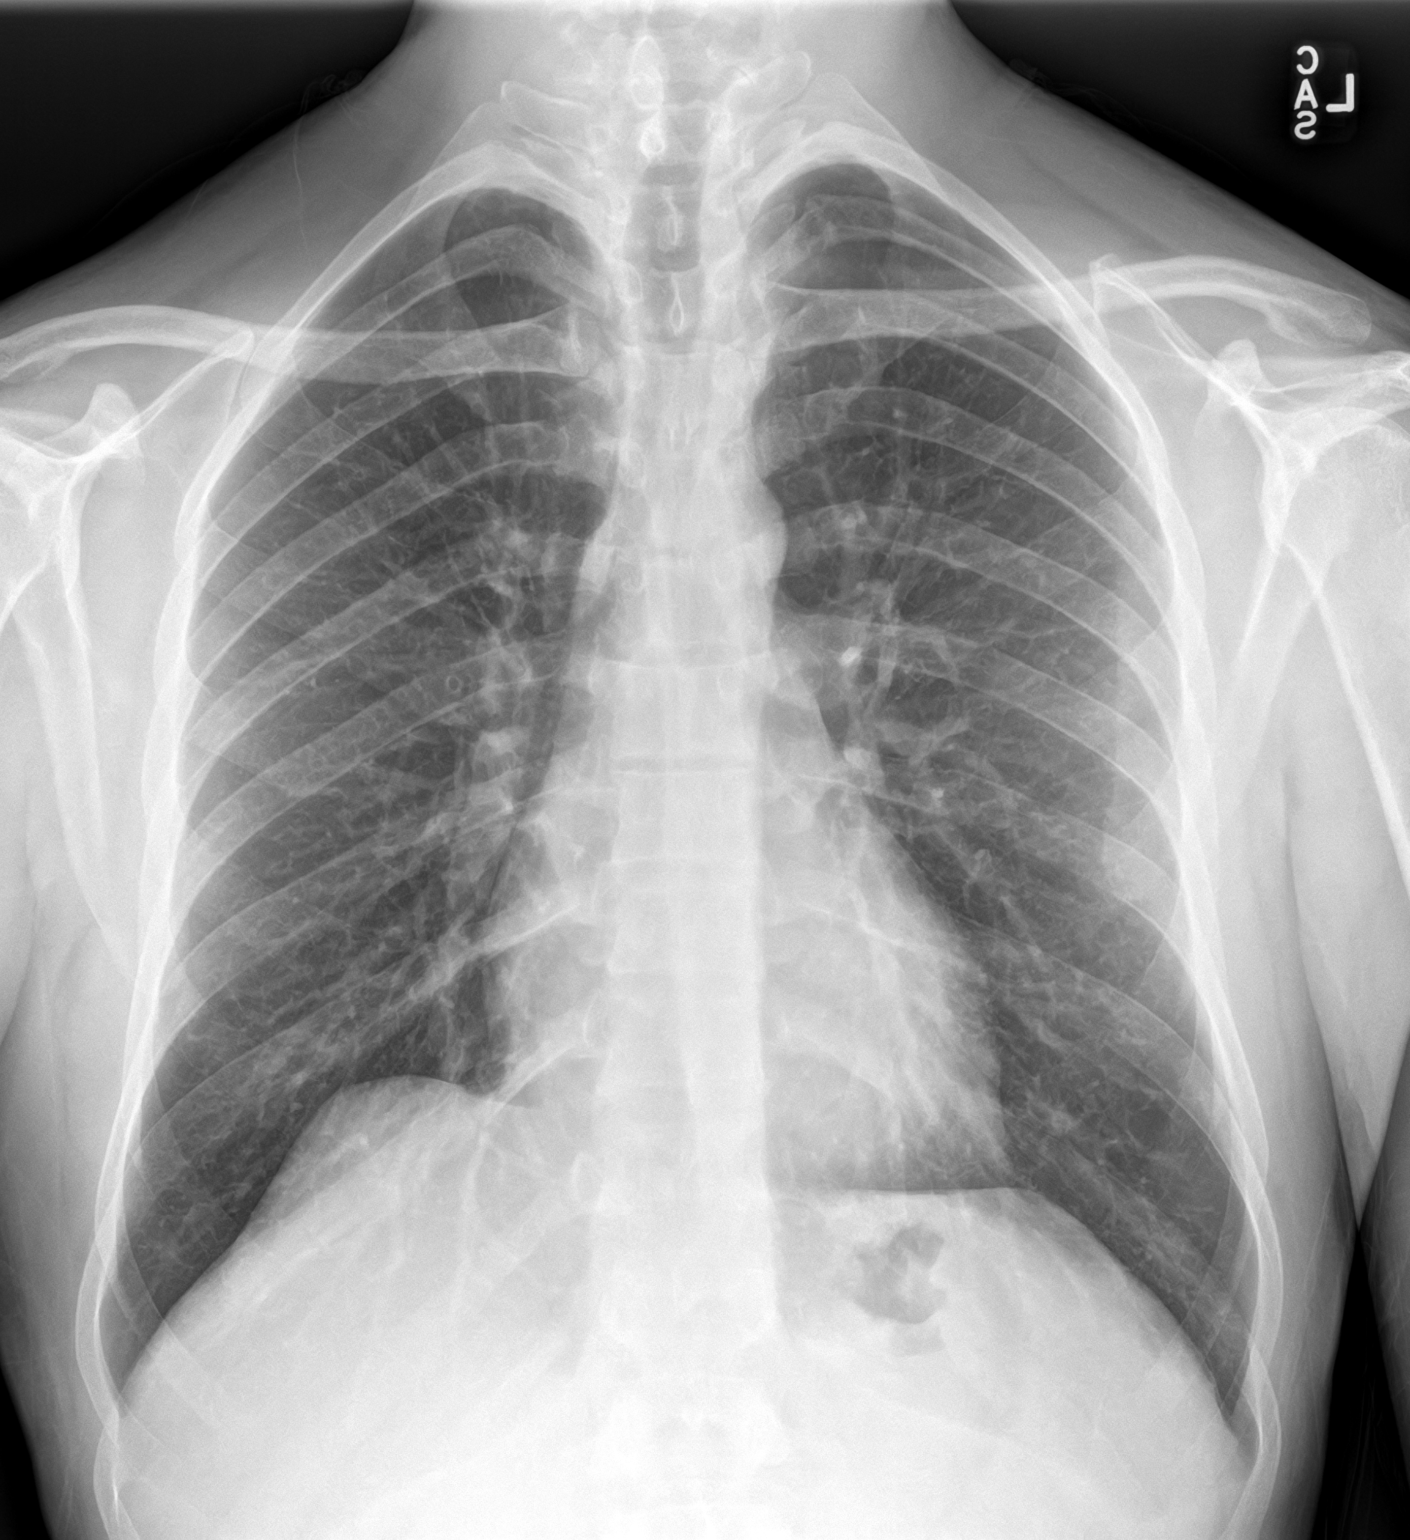

[1 of 1 positions shown; findings below may reference images not displayed]

FINDINGS: There is no evidence of acute infiltrate, pleural effusion or
pneumothorax. The heart size and mediastinal contours are within
normal limits. The visualized skeletal structures are unremarkable.
IMPRESSION: No active disease.

## 2023-07-03 ENCOUNTER — Ambulatory Visit
Admission: RE | Admit: 2023-07-03 | Discharge: 2023-07-03 | Disposition: A | Discharge status: Home / Self Care | Payer: Worker's Compensation | Source: Ambulatory Visit | Attending: Physician Assistant | Admitting: *Deleted

## 2023-07-03 ENCOUNTER — Other Ambulatory Visit: Payer: Self-pay | Admitting: Physician Assistant

## 2023-07-03 DIAGNOSIS — Y990 Civilian activity done for income or pay: Secondary | ICD-10-CM | POA: Insufficient documentation

## 2023-07-03 DIAGNOSIS — Y939 Activity, unspecified: Secondary | ICD-10-CM | POA: Diagnosis not present

## 2023-07-03 DIAGNOSIS — M79641 Pain in right hand: Secondary | ICD-10-CM | POA: Insufficient documentation

## 2023-07-03 DIAGNOSIS — S6991XA Unspecified injury of right wrist, hand and finger(s), initial encounter: Secondary | ICD-10-CM | POA: Insufficient documentation

## 2023-07-03 DIAGNOSIS — X58XXXA Exposure to other specified factors, initial encounter: Secondary | ICD-10-CM | POA: Diagnosis not present

## 2024-01-09 ENCOUNTER — Ambulatory Visit: Payer: Self-pay | Admitting: Internal Medicine

## 2024-01-09 ENCOUNTER — Encounter: Payer: Self-pay | Admitting: Internal Medicine

## 2024-01-09 VITALS — BP 108/67 | HR 77 | Temp 98.4°F | Ht 69.29 in | Wt 149.1 lb

## 2024-01-09 DIAGNOSIS — M545 Low back pain, unspecified: Secondary | ICD-10-CM

## 2024-01-09 NOTE — Progress Notes (Signed)
   Established Patient Office Visit  Subjective   Patient ID: Victor Cross, male    DOB: Mar 01, 1986  Age: 38 y.o. MRN: [id number]  Chief Complaint  Patient presents with   Back Pain    Lower back pain that radiates to right thigh. Pain increases with physical activity. Pain started 2 mo ago    Back Pain      Review of Systems  Musculoskeletal:  Positive for back pain.      Objective:     BP 108/67 (BP Location: Left Arm, Patient Position: Sitting, Cuff Size: Normal)   Pulse 77   Temp 98.4 F (36.9 C) (Oral)   Ht 5' 9.29 (1.76 m)   Wt 149 lb 1.6 oz (67.6 kg)   SpO2 96%   BMI 21.83 kg/m    Physical Exam HENT:     Head: Atraumatic.  Eyes:     Conjunctiva/sclera: Conjunctivae normal.  Musculoskeletal:        General: Normal range of motion.  Neurological:     Mental Status: He is alert.      No results found for any visits on 01/09/24.    The ASCVD Risk score (Arnett DK, et al., 2019) failed to calculate for the following reasons:   The 2019 ASCVD risk score is only valid for ages 57 to 29    Assessment & Plan:   Problem List Items Addressed This Visit       Other   Low back pain - Primary   Ibuprofen  as needed No follow-ups on file.  F/U as needed  Tyronne GORMAN Kos, MD

## 2024-02-13 ENCOUNTER — Encounter: Payer: Self-pay | Admitting: Orthopedic Surgery

## 2024-02-13 ENCOUNTER — Ambulatory Visit: Payer: Self-pay | Admitting: Orthopedic Surgery

## 2024-02-13 VITALS — BP 108/74 | HR 99 | Temp 97.5°F | Ht 69.0 in | Wt 129.0 lb

## 2024-02-13 DIAGNOSIS — M545 Low back pain, unspecified: Secondary | ICD-10-CM

## 2024-02-13 MED ORDER — DICLOFENAC SODIUM 75 MG PO TBEC: 75.0000 mg | DELAYED_RELEASE_TABLET | Freq: Two times a day (BID) | ORAL | 2 refills | Status: AC

## 2024-02-13 NOTE — Progress Notes (Signed)
 S; LBP O: R sideed SI joint pain   -ve SLR A; mechanical low pain P: Stretching exerrcises      NSAIDs      Xrays L spine

## 2024-02-13 NOTE — Addendum Note (Signed)
 Addended by: RENATO ROMBERG on: 02/13/2024 10:41 AM   Modules accepted: Orders

## 2024-03-12 ENCOUNTER — Ambulatory Visit: Payer: Self-pay | Admitting: Student in an Organized Health Care Education/Training Program

## 2024-03-23 ENCOUNTER — Ambulatory Visit
Admission: RE | Admit: 2024-03-23 | Discharge: 2024-03-23 | Disposition: A | Discharge status: Home / Self Care | Payer: Self-pay | Source: Ambulatory Visit | Attending: Orthopedic Surgery | Admitting: Orthopedic Surgery

## 2024-03-23 ENCOUNTER — Ambulatory Visit
Admission: RE | Admit: 2024-03-23 | Discharge: 2024-03-23 | Disposition: A | Discharge status: Home / Self Care | Payer: Self-pay | Attending: Orthopedic Surgery | Admitting: Orthopedic Surgery

## 2024-03-23 DIAGNOSIS — G8929 Other chronic pain: Secondary | ICD-10-CM

## 2024-03-23 DIAGNOSIS — M545 Low back pain, unspecified: Secondary | ICD-10-CM | POA: Insufficient documentation

## 2024-03-29 ENCOUNTER — Encounter: Payer: Self-pay | Admitting: Nurse Practitioner

## 2024-03-29 ENCOUNTER — Ambulatory Visit: Payer: Worker's Compensation | Admitting: Nurse Practitioner

## 2024-04-02 ENCOUNTER — Ambulatory Visit: Payer: Self-pay | Admitting: Gastroenterology

## 2024-04-02 ENCOUNTER — Other Ambulatory Visit: Payer: Self-pay | Admitting: Internal Medicine

## 2024-04-02 ENCOUNTER — Ambulatory Visit: Payer: Self-pay | Admitting: Student in an Organized Health Care Education/Training Program

## 2024-04-02 VITALS — BP 131/67 | HR 69 | Temp 97.5°F | Ht 68.0 in | Wt 150.4 lb

## 2024-04-02 DIAGNOSIS — M545 Low back pain, unspecified: Secondary | ICD-10-CM

## 2024-04-02 MED ORDER — ACETAMINOPHEN ER 650 MG PO TBCR: 650.0000 mg | EXTENDED_RELEASE_TABLET | Freq: Three times a day (TID) | ORAL | 0 refills | Status: DC | PRN

## 2024-04-02 MED ORDER — AMOXICILLIN 500 MG PO CAPS: 500.0000 mg | ORAL_CAPSULE | Freq: Three times a day (TID) | ORAL | 0 refills | Status: DC

## 2024-04-02 MED ORDER — METHOCARBAMOL 750 MG PO TABS: 750.0000 mg | ORAL_TABLET | Freq: Four times a day (QID) | ORAL | 0 refills | Status: AC

## 2024-04-02 MED ORDER — ACETAMINOPHEN ER 650 MG PO TBCR: 650.0000 mg | EXTENDED_RELEASE_TABLET | Freq: Three times a day (TID) | ORAL | 0 refills | Status: AC | PRN

## 2024-04-02 MED ORDER — METHOCARBAMOL 750 MG PO TABS: 750.0000 mg | ORAL_TABLET | Freq: Four times a day (QID) | ORAL | 0 refills | Status: DC

## 2024-04-02 MED ORDER — ACETAMINOPHEN 325 MG PO TABS: 650.0000 mg | ORAL_TABLET | Freq: Four times a day (QID) | ORAL | 0 refills | Status: AC | PRN

## 2024-04-02 MED ORDER — AMOXICILLIN 500 MG PO CAPS: 500.0000 mg | ORAL_CAPSULE | Freq: Three times a day (TID) | ORAL | 0 refills | Status: AC

## 2024-04-02 NOTE — Progress Notes (Signed)
 Victor Cross Victor Cross Victor Cross , M.D. Gastroenterology & Hepatology [facility] [address] [place] ,[place] [address] Primary Care Physician: Victor Chrystal [name], MD [address] [place] [address]  Chief Complaint: Back pain  History of Present Illness: Victor Cross is a 38 y.o. male who presents for evaluation of back pain  Patient reports lower back pain which has been going on for 6 months.  He wakes up without any pain but as the day progresses the pain gets worse no radiating or numbness or tingling.  Patient works as a labor.  Denies any trauma.The patient denies having any nausea, vomiting, fever, chills, hematochezia, melena, hematemesis, abdominal distention, abdominal pain, diarrhea, jaundice, pruritus or weight loss.   Labs from 01/2022 normal liver enzymes Hemoglobin 14.3 platelet 218  Vitamin D  20  Past Medical History: Past Medical History:  Diagnosis Date   Patient denies medical problems     Past Surgical History: Past Surgical History:  Procedure Laterality Date   denies      Family History:No family history on file.  Social History: Social History   Tobacco Use  Smoking Status Former   Types: Cigarettes  Smokeless Tobacco Current   Types: Chew   Social History   Substance and Sexual Activity  Alcohol Use Never   Social History   Substance and Sexual Activity  Drug Use Never    Allergies: No Known Allergies  Medications: Current Outpatient Medications  Medication Sig Dispense Refill   amoxicillin  (AMOXIL ) 125 MG chewable tablet Chew 125 mg by mouth 2 (two) times daily. Pt is taking amox due to ear infection (Patient not taking: Reported on 01/09/2024)     diclofenac  (VOLTAREN ) 75 MG EC tablet Take 1 tablet (75 mg total) by mouth 2 (two) times daily. 30 tablet 2   ibuprofen  (ADVIL ) 600 MG tablet Take 1 tablet (600 mg total) by mouth every 8 (eight) hours as needed for moderate pain. (Patient not taking: Reported on  01/09/2024) 90 tablet 1   ofloxacin  (FLOXIN  OTIC) 0.3 % OTIC solution Place 5 drops into the left ear daily. (Patient not taking: Reported on 01/09/2024) 5 mL 0   Current Facility-Administered Medications  Medication Dose Route Frequency Provider Last Rate Last Admin   ofloxacin  (FLOXIN ) 0.3 % OTIC (EAR) solution 5 drop  5 drop Left EAR Daily Tejan-Sie, S Timothee Gali, MD        Review of Systems: GENERAL: negative for malaise, night sweats HEENT: No changes in hearing or vision, no nose bleeds or other nasal problems. NECK: Negative for lumps, goiter, pain and significant neck swelling RESPIRATORY: Negative for cough, wheezing CARDIOVASCULAR: Negative for chest pain, leg swelling, palpitations, orthopnea GI: SEE HPI MUSCULOSKELETAL: Negative for joint pain or swelling, back pain, and muscle pain. SKIN: Negative for lesions, rash HEMATOLOGY Negative for prolonged bleeding, bruising easily, and swollen nodes. ENDOCRINE: Negative for cold or heat intolerance, polyuria, polydipsia and goiter. NEURO: negative for tremor, gait imbalance, syncope and seizures. The remainder of the review of systems is noncontributory.   Physical Exam: There were no vitals taken for this visit. GENERAL: The patient is AO x3, in no acute distress. HEENT: Head is normocephalic and atraumatic. EOMI are intact. Mouth is well hydrated and without lesions. NECK: Supple. No masses LUNGS: Clear to auscultation. No presence of rhonchi/wheezing/rales. Adequate chest expansion HEART: RRR, normal s1 and s2. ABDOMEN: Soft, nontender, no guarding, no peritoneal signs, and nondistended. BS +. No masses.  Imaging/Labs: as above  Latest Ref Rng & Units 02/15/2022   11:55 AM 02/10/2022   11:18 AM 10/16/2021   10:31 AM  CBC  WBC 4.0 - 10.5 K/uL 6.0  5.4  4.3   Hemoglobin 13.0 - 17.0 g/dL 85.6  84.8  86.0   Hematocrit 39.0 - 52.0 % 40.5  42.4  40.7   Platelets 150 - 400 K/uL 218  197  181    No results found for: IRON,  TIBC, FERRITIN  I personally reviewed and interpreted the available labs, imaging and endoscopic files.  Impression and Plan: Victor Cross is a 38 y.o. male who presents for evaluation of back pain  #Lower back pain  Vitamin D  deficiency  Lower back pain appears musculoskeletal in nature recent x-ray without any significant findings.  Pain worsens throughout the day improves with rest  Recommend Tylenol  as needed and Robaxin at night if pain is worse Recommend vitamin D  1000 international unit with food daily  If pain worsen can follow-up with pain management  All questions were answered.      =================  The patient was evaluated today at the [facility] in [place]. Services were provided at no cost to the patient. This visit was part of our ongoing community outreach efforts to improve access to healthcare for underserved populations.   Victor Cross Victor Cross Victor Shiplett, MD Gastroenterology and Hepatology   This chart has been completed using [facility] Dictation software, and while attempts have been made to ensure accuracy , certain words and phrases may not be transcribed as intended

## 2024-04-02 NOTE — Addendum Note (Signed)
 Addended by: CINDERELLA DEATRICE SMILES on: 04/02/2024 09:40 AM   Modules accepted: Orders

## 2024-04-03 ENCOUNTER — Ambulatory Visit: Payer: Self-pay | Admitting: Student in an Organized Health Care Education/Training Program

## 2024-05-14 ENCOUNTER — Encounter: Payer: Self-pay | Admitting: Internal Medicine
